# Patient Record
Sex: Female | Born: 1960 | Race: White | Hispanic: No | Smoking: Never smoker
Health system: Southern US, Community
[De-identification: ages and names within clinical notes are randomized; demographics above are authoritative.]

## PROBLEM LIST (undated history)

## (undated) HISTORY — PX: APPENDECTOMY: SHX54

## (undated) HISTORY — PX: CERVICAL ABLATION: SHX5771

---

## 2011-07-13 ENCOUNTER — Ambulatory Visit: Payer: Self-pay | Admitting: Family Medicine

## 2011-07-14 ENCOUNTER — Ambulatory Visit: Payer: Self-pay | Admitting: Family Medicine

## 2012-06-29 ENCOUNTER — Emergency Department: Payer: Self-pay | Admitting: Emergency Medicine

## 2012-06-29 LAB — CK TOTAL AND CKMB (NOT AT ARMC)
CK, Total: 52 U/L (ref 21–215)
CK-MB: <0.5 ng/mL — ABNORMAL LOW (ref 0.5–3.6)

## 2012-06-29 LAB — CBC
Platelet: 219 10*3/uL (ref 150–440)
RBC: 4.46 10*6/uL (ref 3.80–5.20)
WBC: 4.4 10*3/uL (ref 3.6–11.0)

## 2012-06-29 LAB — TSH: Thyroid Stimulating Horm: 4.61 u[IU]/mL — ABNORMAL HIGH

## 2012-06-29 LAB — BASIC METABOLIC PANEL
Co2: 24 mmol/L (ref 21–32)
Creatinine: 1.01 mg/dL (ref 0.60–1.30)
EGFR (African American): >60
EGFR (Non-African Amer.): >60
Osmolality: 284 (ref 275–301)
Sodium: 141 mmol/L (ref 136–145)

## 2012-06-29 LAB — T4, FREE: Free Thyroxine: 1.18 ng/dL (ref 0.76–1.46)

## 2013-07-09 IMAGING — CR DG CHEST 1V PORT
1 series · 1 of 1 positions shown · non-contrast
Comparison: none

REASON FOR EXAM: Chest Pain
COMMENTS:

PROCEDURE:     DXR - DXR PORTABLE CHEST SINGLE VIEW  - June 29, 2012  [DATE]
RESULT:     The lungs are adequately inflated. There is no focal infiltrate.
The cardiac silhouette is normal in size. The pulmonary vascularity is not
engorged. There is no pleural effusion.

[portable]
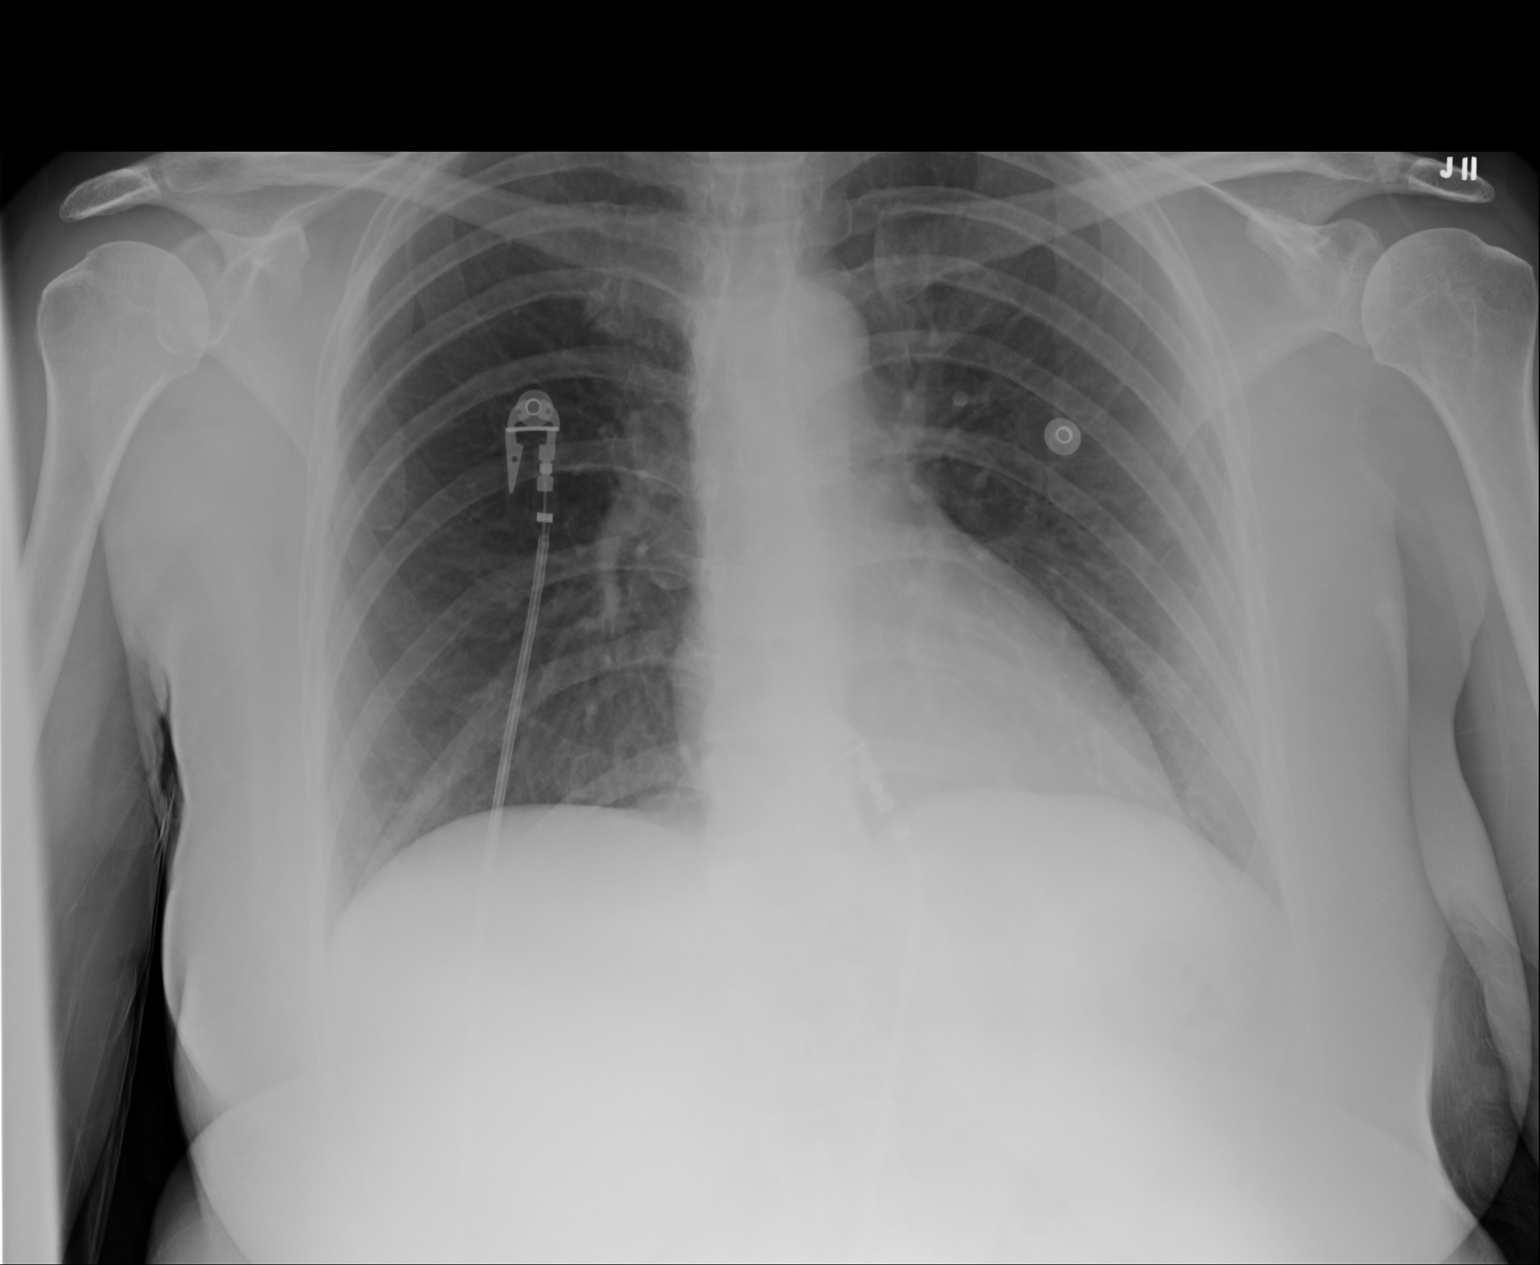

[1 of 1 positions shown; findings below may reference images not displayed]

IMPRESSION: There is no evidence of acute cardiopulmonary abnormality.

## 2017-04-10 ENCOUNTER — Ambulatory Visit
Admission: EM | Admit: 2017-04-10 | Discharge: 2017-04-10 | Disposition: A | Discharge status: Home / Self Care | Payer: Self-pay | Attending: Emergency Medicine | Admitting: Emergency Medicine

## 2017-04-10 ENCOUNTER — Ambulatory Visit (INDEPENDENT_AMBULATORY_CARE_PROVIDER_SITE_OTHER): Payer: Self-pay

## 2017-04-10 DIAGNOSIS — M25552 Pain in left hip: Secondary | ICD-10-CM

## 2017-04-10 DIAGNOSIS — M25559 Pain in unspecified hip: Secondary | ICD-10-CM

## 2017-04-10 DIAGNOSIS — S73102A Unspecified sprain of left hip, initial encounter: Secondary | ICD-10-CM

## 2017-04-10 MED ORDER — HYDROCODONE-ACETAMINOPHEN 5-325 MG PO TABS: 1.0000 | ORAL_TABLET | ORAL | 0 refills | Status: DC | PRN

## 2017-04-10 NOTE — Discharge Instructions (Signed)
1 g of Tylenol with 500 mg of Aleve twice a day. You may take an additional gram of Tylenol twice a day. Or if your pain is not controlled with this, you may take the Norco. Do not take the Norco and the Tylenol, as they both have Tylenol in them and too much can hurt your liver. Each tablet is Norco as 325 mg of Tylenol in it. Do not exceed 4 g of Tylenol from all sources in one day.

## 2017-04-10 NOTE — ED Triage Notes (Signed)
Patient complains of left hip pain that started on 10 days ago. Patient states that she saw her chiropractor on Wednesday and noticed improvement on Friday. Patient states that she fell coming down off of a step ladder last night and landed with all her weight on her left leg and jammed. Patient states that she heard a loud noise and then instantly had excruiating pain. Patient states that pain is still constant and caused her to have diarrhea and be nausea.

## 2017-04-10 NOTE — ED Provider Notes (Signed)
HPI  SUBJECTIVE:  Andrea Campbell is a 56 y.o. female who presents with left groin pain starting yesterday after she came off the stepladder onto a fully extended left leg. She states that she heard a pop, but was not sure of the location. She states that was followed by immediate severe pain that shot to her knee and nausea. She now reports constant discomfort and sharp, stabbing pain with any movement of her hip whatsoever particularly with hip flexion. She tried heat, and 2 tabs of Aleve at a time. The heat and keeping her leg straight seems to help. Symptoms are worse with hip flexion or with any movement. She denies bruising, swelling, numbness, tingling, distal weakness, knee pain, back pain. No rash. She states that she was having pain in her left hip all week, but states this feels different. She can bear weight on it with some discomfort. She has a past medical history of left hip bursitis. No history of diabetes, hypertension, osteoporosis, cancer. LMP: Status post ablation. PMD: None.    History reviewed. No pertinent past medical history.  Past Surgical History:  Procedure Laterality Date  . APPENDECTOMY    . CERVICAL ABLATION      History reviewed. No pertinent family history.  Social History  Substance Use Topics  . Smoking status: Never Smoker  . Smokeless tobacco: Never Used  . Alcohol use No    No current facility-administered medications for this encounter.   Current Outpatient Prescriptions:  .  progesterone (PROMETRIUM) 200 MG capsule, Take 200 mg by mouth daily., Disp: , Rfl:  .  thyroid (ARMOUR) 65 MG tablet, Take 65 mg by mouth daily., Disp: , Rfl:   No Known Allergies   ROS  As noted in HPI.   Physical Exam  BP (!) 117/52 (BP Location: Left Arm)   Pulse (!) 120   Temp 97.7 F (36.5 C) (Oral)   Resp 18   Ht 5' 2.5" (1.588 m)   Wt 155 lb (70.3 kg)   SpO2 100%   BMI 27.90 kg/m   Constitutional: Well developed, well nourished, appears  uncomfortable Eyes:  EOMI, conjunctiva normal bilaterally HENT: Normocephalic, atraumatic,mucus membranes moist Respiratory: Normal inspiratory effort Cardiovascular: Normal rate GI: nondistended skin: No rash, skin intact Musculoskeletal: No signs of trauma L hip. No bruising, erythema, rash. no muscular tenderness over gluteal muscles, down IT band, quadriceps. Tenderness over the hamstrings. pain with passive abduction>adduction of leg. pain with int>ext rotation hip. Discomfort but no real pain with flexion. No tenderness at sciatic notch. Roll test for muscle spasm negative. Flexion/extension knee WNL. Knee joint NT, stable. Motor strength flexion/ext hip 4/5. Sensation to LT intact. Patient able to bear some weight on her hip. Neurologic: Alert & oriented x 3, no focal neuro deficits Psychiatric: Speech and behavior appropriate   ED Course   Medications - No data to display  Orders Placed This Encounter  Procedures  . DG Hip Unilat With Pelvis 2-3 Views Left    Standing Status:   Standing    Number of Occurrences:   1    Order Specific Question:   Symptom/Reason for Exam    Answer:   Hip pain [[id number]]    Order Specific Question:   Radiology Contrast Protocol - do NOT remove file path    Answer:   [email or web address]    No results found for this or any previous visit (from the past 24 hour(s)). Dg Hip Unilat With Pelvis 2-3 Views Left  Result Date:  04/10/2017 CLINICAL DATA:  Pain after slipping on ladder EXAM: DG HIP (WITH OR WITHOUT PELVIS) 2-3V LEFT COMPARISON:  None. FINDINGS: Frontal pelvis as well as frontal and lateral left hip images were obtained. There is no fracture or dislocation. Joint spaces appear normal. No erosive change. IMPRESSION: No fracture or dislocation.  No appreciable arthropathy. Electronically Signed   By: Bretta Bang III M.D.   On: 04/10/2017 13:41    ED Clinical Impression  Hip pain - Plan: DG Hip  Unilat With Pelvis 2-3 Views Left, DG Hip Unilat With Pelvis 2-3 Views Left   ED Assessment/Plan  Checking left hip x-ray to rule out fracture. Patient took Aleve prior to evaluation so withholding Toradol.  Imaging independently reviewed. No fracture or dislocation. Normal joint spaces. See radiology report for details.  No evidence of fracture, although she may have a hairline fracture not seen on x-ray. No evidence of septic joint. Presentation most consistent with a hip sprain. Home with Norco, continue Aleve, continue heat, follow-up with Dr. Martha Clan, orthopedic surgeon on call if no better in several days. She states she has plenty of Aleve at home. To the ER if she gets worse or for pain not controlled with medicines.   Discussed  imaging, MDM, plan and followup with patient. Discussed sn/sx that should prompt return to the ED. Patient agrees with plan.   Meds ordered this encounter  Medications  . thyroid (ARMOUR) 65 MG tablet    Sig: Take 65 mg by mouth daily.  . progesterone (PROMETRIUM) 200 MG capsule    Sig: Take 200 mg by mouth daily.    *This clinic note was created using Dragon dictation software. Therefore, there may be occasional mistakes despite careful proofreading.  ?   Domenick Gong, MD 04/10/17 1756

## 2017-12-30 ENCOUNTER — Other Ambulatory Visit: Payer: Self-pay

## 2017-12-30 ENCOUNTER — Ambulatory Visit
Admission: EM | Admit: 2017-12-30 | Discharge: 2017-12-30 | Disposition: A | Discharge status: Home / Self Care | Payer: Self-pay | Attending: Family Medicine | Admitting: Family Medicine

## 2017-12-30 DIAGNOSIS — K8050 Calculus of bile duct without cholangitis or cholecystitis without obstruction: Secondary | ICD-10-CM

## 2017-12-30 DIAGNOSIS — R110 Nausea: Secondary | ICD-10-CM

## 2017-12-30 DIAGNOSIS — R1010 Upper abdominal pain, unspecified: Secondary | ICD-10-CM

## 2017-12-30 LAB — URINALYSIS, COMPLETE (UACMP) WITH MICROSCOPIC
BILIRUBIN URINE: NEGATIVE
Glucose, UA: NEGATIVE mg/dL
HGB URINE DIPSTICK: NEGATIVE
KETONES UR: NEGATIVE mg/dL
LEUKOCYTES UA: NEGATIVE
NITRITE: NEGATIVE
PROTEIN: NEGATIVE mg/dL
Specific Gravity, Urine: 1.01 (ref 1.005–1.030)
pH: 6.5 (ref 5.0–8.0)

## 2017-12-30 LAB — COMPREHENSIVE METABOLIC PANEL
ALBUMIN: 4.3 g/dL (ref 3.5–5.0)
ALT: 25 U/L (ref 14–54)
ANION GAP: 7 (ref 5–15)
AST: 28 U/L (ref 15–41)
Alkaline Phosphatase: 80 U/L (ref 38–126)
BUN: 21 mg/dL — ABNORMAL HIGH (ref 6–20)
CHLORIDE: 98 mmol/L — AB (ref 101–111)
CO2: 28 mmol/L (ref 22–32)
Calcium: 8.9 mg/dL (ref 8.9–10.3)
Creatinine, Ser: 0.98 mg/dL (ref 0.44–1.00)
GFR calc Af Amer: >60 mL/min (ref >60)
GFR calc non Af Amer: >60 mL/min (ref >60)
GLUCOSE: 96 mg/dL (ref 65–99)
POTASSIUM: 4.2 mmol/L (ref 3.5–5.1)
SODIUM: 133 mmol/L — AB (ref 135–145)
Total Bilirubin: 0.5 mg/dL (ref 0.3–1.2)
Total Protein: 7.7 g/dL (ref 6.5–8.1)

## 2017-12-30 LAB — CBC WITH DIFFERENTIAL/PLATELET
BASOS PCT: 1 %
Basophils Absolute: 0 10*3/uL (ref 0–0.1)
EOS ABS: 0.3 10*3/uL (ref 0–0.7)
EOS PCT: 5 %
HCT: 41.9 % (ref 35.0–47.0)
Hemoglobin: 14.5 g/dL (ref 12.0–16.0)
Lymphocytes Relative: 38 %
Lymphs Abs: 2 10*3/uL (ref 1.0–3.6)
MCH: 31.2 pg (ref 26.0–34.0)
MCHC: 34.6 g/dL (ref 32.0–36.0)
MCV: 90.3 fL (ref 80.0–100.0)
MONOS PCT: 11 %
Monocytes Absolute: 0.6 10*3/uL (ref 0.2–0.9)
Neutro Abs: 2.4 10*3/uL (ref 1.4–6.5)
Neutrophils Relative %: 45 %
PLATELETS: 241 10*3/uL (ref 150–440)
RBC: 4.64 MIL/uL (ref 3.80–5.20)
RDW: 13.1 % (ref 11.5–14.5)
WBC: 5.3 10*3/uL (ref 3.6–11.0)

## 2017-12-30 LAB — LIPASE, BLOOD: Lipase: 28 U/L (ref 11–51)

## 2017-12-30 MED ORDER — ALBUTEROL SULFATE (2.5 MG/3ML) 0.083% IN NEBU: 2.5000 mg | INHALATION_SOLUTION | Freq: Once | RESPIRATORY_TRACT | Status: DC

## 2017-12-30 MED ORDER — HYDROCODONE-ACETAMINOPHEN 5-325 MG PO TABS: ORAL_TABLET | ORAL | 0 refills | Status: DC

## 2017-12-30 MED ORDER — ONDANSETRON 8 MG PO TBDP
8.0000 mg | ORAL_TABLET | Freq: Once | ORAL | Status: AC
  Administered 2017-12-30: 8 mg via ORAL

## 2017-12-30 MED ORDER — ONDANSETRON 8 MG PO TBDP: 8.0000 mg | ORAL_TABLET | Freq: Three times a day (TID) | ORAL | 0 refills | Status: DC | PRN

## 2017-12-30 NOTE — Discharge Instructions (Signed)
Go to Emergency Department if symptoms worsen °

## 2017-12-30 NOTE — ED Triage Notes (Signed)
Patient complains of abdominal pain that started originally 2 weeks ago. Patient states that since then she has been having nausea, vomiting and discomfort. Patient states that she feels like this could be her gallbladder. Patient states that she notices pressure in her abdomen and her back when the episodes happen.

## 2017-12-30 NOTE — ED Provider Notes (Signed)
[facility]    CSN: [id number] Arrival date & time: 12/30/17  1356     History   Chief Complaint Chief Complaint  Patient presents with  . Abdominal Pain    HPI Andrea Campbell is a 57 y.o. female.   57 yo female with a c/o abdominal pain on and off for the past 2 weeks. Pain is in the upper abdomen , mainly on the right side, and associated with nausea and occasionally vomiting. Denies any fevers, chills, melena, hematochezia, hematemesis. States she ate several hours ago, now nauseated.    The history is provided by the patient.  Abdominal Pain    History reviewed. No pertinent past medical history.  There are no active problems to display for this patient.   Past Surgical History:  Procedure Laterality Date  . APPENDECTOMY    . CERVICAL ABLATION      OB History    No data available       Home Medications    Prior to Admission medications   Medication Sig Start Date End Date Taking? Authorizing Provider  progesterone (PROMETRIUM) 200 MG capsule Take 200 mg by mouth daily.   Yes [provider]  thyroid (ARMOUR) 65 MG tablet Take 65 mg by mouth daily.   Yes [provider]  HYDROcodone-acetaminophen (NORCO/VICODIN) 5-325 MG tablet 1-2 tabs po q 8 hours prn 12/30/17   Payton Mccallum, MD  ondansetron (ZOFRAN ODT) 8 MG disintegrating tablet Take 1 tablet (8 mg total) by mouth every 8 (eight) hours as needed. 12/30/17   Payton Mccallum, MD    Family History Family History  Problem Relation Age of Onset  . Heart failure Mother   . Dementia Father     Social History Social History   Tobacco Use  . Smoking status: Never Smoker  . Smokeless tobacco: Never Used  Substance Use Topics  . Alcohol use: Yes    Comment: occasionally  . Drug use: No     Allergies   Patient has no known allergies.   Review of Systems Review of Systems  Gastrointestinal: Positive for abdominal pain.     Physical Exam Triage Vital Signs ED  Triage Vitals  Enc Vitals Group     BP 12/30/17 1410 125/67     Pulse Rate 12/30/17 1410 (!) 101     Resp 12/30/17 1410 18     Temp 12/30/17 1410 97.9 F (36.6 C)     Temp Source 12/30/17 1410 Oral     SpO2 12/30/17 1410 100 %     Weight 12/30/17 1407 165 lb (74.8 kg)     Height 12/30/17 1407 5' 2.5" (1.588 m)     Head Circumference --      Peak Flow --      Pain Score 12/30/17 1407 1     Pain Loc --      Pain Edu? --      Excl. in GC? --    No data found.  Updated Vital Signs BP 125/67 (BP Location: Left Arm)   Pulse (!) 101   Temp 97.9 F (36.6 C) (Oral)   Resp 18   Ht 5' 2.5" (1.588 m)   Wt 165 lb (74.8 kg)   SpO2 100%   BMI 29.70 kg/m   Visual Acuity Right Eye Distance:   Left Eye Distance:   Bilateral Distance:    Right Eye Near:   Left Eye Near:    Bilateral Near:     Physical  Exam  Constitutional: She appears well-developed and well-nourished. No distress.  Pulmonary/Chest: Effort normal. No stridor. No respiratory distress. She has no wheezes.  Abdominal: Soft. Bowel sounds are normal. She exhibits no distension and no mass. There is tenderness (mild; upper abdomen; no rebound or guarding). There is no rebound and no guarding.  Skin: She is not diaphoretic.  Nursing note and vitals reviewed.    UC Treatments / Results  Labs (all labs ordered are listed, but only abnormal results are displayed) Labs Reviewed  COMPREHENSIVE METABOLIC PANEL - Abnormal; Notable for the following components:      Result Value   Sodium 133 (*)    Chloride 98 (*)    BUN 21 (*)    All other components within normal limits  URINALYSIS, COMPLETE (UACMP) WITH MICROSCOPIC - Abnormal; Notable for the following components:   Squamous Epithelial / LPF 0-5 (*)    Bacteria, UA FEW (*)    All other components within normal limits  URINE CULTURE  CBC WITH DIFFERENTIAL/PLATELET  LIPASE, BLOOD    EKG  EKG Interpretation None       Radiology No results  found.  Procedures Procedures (including critical care time)  Medications Ordered in UC Medications  ondansetron (ZOFRAN-ODT) disintegrating tablet 8 mg (8 mg Oral Given 12/30/17 1436)     Initial Impression / Assessment and Plan / UC Course  I have reviewed the triage vital signs and the nursing notes.  Pertinent labs & imaging results that were available during my care of the patient were reviewed by me and considered in my medical decision making (see chart for details).       Final Clinical Impressions(s) / UC Diagnoses   Final diagnoses:  Pain of upper abdomen  Biliary colic    ED Discharge Orders        Ordered    US Abdomen Limited RUQ  Status:  Canceled     12/30/17 1540    ondansetron (ZOFRAN ODT) 8 MG disintegrating tablet  Every 8 hours PRN     12/30/17 1540    HYDROcodone-acetaminophen (NORCO/VICODIN) 5-325 MG tablet     12/30/17 1549     1. Lab results (normal/negative) and diagnosis reviewed with patient 2. rx as per orders above; reviewed possible side effects, interactions, risks and benefits  3. Recommend supportive treatment with liquids and advance diet slowly as tolerated 4. Recommend go to Emergency Department if symptoms worsen over the weekend, otherwise follow up with PCP for possible referral for further work up 5. Follow-up prn if symptoms worsen or don't improve  Controlled Substance Prescriptions [place] Controlled Substance Registry consulted? Not Applicable   Payton Mccallumonty, Bodi Palmeri, MD 12/30/17 [phone]

## 2018-01-01 LAB — URINE CULTURE
Culture: NO GROWTH
SPECIAL REQUESTS: NORMAL

## 2018-01-02 ENCOUNTER — Telehealth: Payer: Self-pay | Admitting: Emergency Medicine

## 2018-01-02 NOTE — Telephone Encounter (Signed)
Called to follow up after patient's recent visit. Left message to call with any questions or concerns. 

## 2018-01-09 ENCOUNTER — Emergency Department: Payer: Self-pay

## 2018-01-09 ENCOUNTER — Emergency Department
Admission: EM | Admit: 2018-01-09 | Discharge: 2018-01-09 | Disposition: A | Discharge status: Home / Self Care | Payer: Self-pay | Attending: Emergency Medicine | Admitting: Emergency Medicine

## 2018-01-09 ENCOUNTER — Encounter: Payer: Self-pay | Admitting: Medical Oncology

## 2018-01-09 DIAGNOSIS — N200 Calculus of kidney: Secondary | ICD-10-CM | POA: Insufficient documentation

## 2018-01-09 DIAGNOSIS — Z79899 Other long term (current) drug therapy: Secondary | ICD-10-CM | POA: Insufficient documentation

## 2018-01-09 DIAGNOSIS — R197 Diarrhea, unspecified: Secondary | ICD-10-CM | POA: Insufficient documentation

## 2018-01-09 DIAGNOSIS — R1010 Upper abdominal pain, unspecified: Secondary | ICD-10-CM

## 2018-01-09 DIAGNOSIS — R112 Nausea with vomiting, unspecified: Secondary | ICD-10-CM | POA: Insufficient documentation

## 2018-01-09 LAB — COMPREHENSIVE METABOLIC PANEL
ALK PHOS: 66 U/L (ref 38–126)
ALT: 25 U/L (ref 14–54)
ANION GAP: 6 (ref 5–15)
AST: 24 U/L (ref 15–41)
Albumin: 4.4 g/dL (ref 3.5–5.0)
BUN: 22 mg/dL — ABNORMAL HIGH (ref 6–20)
CALCIUM: 9 mg/dL (ref 8.9–10.3)
CHLORIDE: 102 mmol/L (ref 101–111)
CO2: 28 mmol/L (ref 22–32)
Creatinine, Ser: 0.96 mg/dL (ref 0.44–1.00)
Glucose, Bld: 90 mg/dL (ref 65–99)
Potassium: 4 mmol/L (ref 3.5–5.1)
SODIUM: 136 mmol/L (ref 135–145)
Total Bilirubin: 0.9 mg/dL (ref 0.3–1.2)
Total Protein: 7.4 g/dL (ref 6.5–8.1)

## 2018-01-09 LAB — URINALYSIS, COMPLETE (UACMP) WITH MICROSCOPIC
Bilirubin Urine: NEGATIVE
Glucose, UA: NEGATIVE mg/dL
Ketones, ur: NEGATIVE mg/dL
Leukocytes, UA: NEGATIVE
NITRITE: NEGATIVE
Protein, ur: NEGATIVE mg/dL
SPECIFIC GRAVITY, URINE: 1.019 (ref 1.005–1.030)
pH: 5 (ref 5.0–8.0)

## 2018-01-09 LAB — CBC
HCT: 42.7 % (ref 35.0–47.0)
HEMOGLOBIN: 14.5 g/dL (ref 12.0–16.0)
MCH: 31.2 pg (ref 26.0–34.0)
MCHC: 34 g/dL (ref 32.0–36.0)
MCV: 91.6 fL (ref 80.0–100.0)
Platelets: 258 10*3/uL (ref 150–440)
RBC: 4.66 MIL/uL (ref 3.80–5.20)
RDW: 13.4 % (ref 11.5–14.5)
WBC: 4.8 10*3/uL (ref 3.6–11.0)

## 2018-01-09 LAB — LIPASE, BLOOD: LIPASE: 30 U/L (ref 11–51)

## 2018-01-09 LAB — POCT PREGNANCY, URINE: PREG TEST UR: NEGATIVE

## 2018-01-09 MED ORDER — ONDANSETRON 4 MG PO TBDP
4.0000 mg | ORAL_TABLET | Freq: Once | ORAL | Status: AC | PRN
  Administered 2018-01-09: 4 mg via ORAL
  Filled 2018-01-09: qty 1

## 2018-01-09 MED ORDER — SODIUM CHLORIDE 0.9 % IV SOLN
1000.0000 mL | Freq: Once | INTRAVENOUS | Status: AC
  Administered 2018-01-09: 1000 mL via INTRAVENOUS

## 2018-01-09 MED ORDER — ONDANSETRON 4 MG PO TBDP
4.0000 mg | ORAL_TABLET | Freq: Once | ORAL | Status: AC
  Administered 2018-01-09: 4 mg via ORAL

## 2018-01-09 MED ORDER — NAPROXEN 500 MG PO TABS: 500.0000 mg | ORAL_TABLET | Freq: Two times a day (BID) | ORAL | 2 refills | Status: DC

## 2018-01-09 MED ORDER — TAMSULOSIN HCL 0.4 MG PO CAPS: 0.4000 mg | ORAL_CAPSULE | Freq: Every day | ORAL | 0 refills | Status: DC

## 2018-01-09 MED ORDER — HYDROCODONE-ACETAMINOPHEN 5-325 MG PO TABS: 1.0000 | ORAL_TABLET | Freq: Four times a day (QID) | ORAL | 0 refills | Status: DC | PRN

## 2018-01-09 MED ORDER — ONDANSETRON 4 MG PO TBDP
ORAL_TABLET | ORAL | Status: AC
  Filled 2018-01-09: qty 1

## 2018-01-09 MED ORDER — ONDANSETRON 4 MG PO TBDP: 4.0000 mg | ORAL_TABLET | Freq: Three times a day (TID) | ORAL | 0 refills | Status: DC | PRN

## 2018-01-09 MED ORDER — PROMETHAZINE HCL 25 MG/ML IJ SOLN
12.5000 mg | Freq: Once | INTRAMUSCULAR | Status: AC
  Administered 2018-01-09: 12.5 mg via INTRAVENOUS
  Filled 2018-01-09: qty 1

## 2018-01-09 MED ORDER — KETOROLAC TROMETHAMINE 30 MG/ML IJ SOLN
30.0000 mg | Freq: Once | INTRAMUSCULAR | Status: AC
  Administered 2018-01-09: 30 mg via INTRAVENOUS
  Filled 2018-01-09: qty 1

## 2018-01-09 NOTE — ED Notes (Signed)
Per Dr. Cyril LoosenKinner, okay for patient to not get full bag of fluids at this time.

## 2018-01-09 NOTE — ED Provider Notes (Signed)
[facility] Emergency Department Provider Note   ____________________________________________    I have reviewed the triage vital signs and the nursing notes.   HISTORY  Chief Complaint Abdominal Pain and Flank Pain     HPI Andrea Campbell is a 57 y.o. female who presents with complaints of abdominal pain primarily in the right upper quadrant.  Patient reports over the last 1-2 months she has had numerous episodes of severe right upper quadrant pain occasionally followed by diarrhea but frequently with nausea and vomiting.  No history of kidney stones.  Has a history of an appendectomy.  She reports that the she generally has a dull ache in her right upper quadrant pretty much all the time now.  No fevers or chills.  No chest pain or shortness of breath or pleurisy.  She has not taken anything for the pain   History reviewed. No pertinent past medical history.  There are no active problems to display for this patient.   Past Surgical History:  Procedure Laterality Date  . APPENDECTOMY    . CERVICAL ABLATION      Prior to Admission medications   Medication Sig Start Date End Date Taking? Authorizing Provider  HYDROcodone-acetaminophen (NORCO/VICODIN) 5-325 MG tablet Take 1 tablet by mouth every 6 (six) hours as needed for severe pain. 01/09/18   Jene Every, MD  naproxen (NAPROSYN) 500 MG tablet Take 1 tablet (500 mg total) by mouth 2 (two) times daily with a meal. 01/09/18   Jene Every, MD  ondansetron (ZOFRAN ODT) 4 MG disintegrating tablet Take 1 tablet (4 mg total) by mouth every 8 (eight) hours as needed for nausea or vomiting. 01/09/18   Jene Every, MD  progesterone (PROMETRIUM) 200 MG capsule Take 200 mg by mouth daily.    [provider]  tamsulosin (FLOMAX) 0.4 MG CAPS capsule Take 1 capsule (0.4 mg total) by mouth daily. 01/09/18   Jene Every, MD  thyroid (ARMOUR) 65 MG tablet Take 65 mg by mouth daily.    [provider]     Allergies Patient has no known allergies.  Family History  Problem Relation Age of Onset  . Heart failure Mother   . Dementia Father     Social History Social History   Tobacco Use  . Smoking status: Never Smoker  . Smokeless tobacco: Never Used  Substance Use Topics  . Alcohol use: Yes    Comment: occasionally  . Drug use: No    Review of Systems  Constitutional: No fevers Eyes: No visual changes.  ENT: No neck pain Cardiovascular: Denies chest pain. Respiratory: Denies shortness of breath. Gastrointestinal: Abdominal pain as discussed above Genitourinary: Negative for dysuria. Musculoskeletal: Negative for body aches Skin: Negative for rash. Neurological: Negative for headaches    ____________________________________________   PHYSICAL EXAM:  VITAL SIGNS: ED Triage Vitals  Enc Vitals Group     BP 01/09/18 0957 120/73     Pulse Rate 01/09/18 0957 86     Resp 01/09/18 0957 18     Temp 01/09/18 0957 97.9 F (36.6 C)     Temp Source 01/09/18 0957 Oral     SpO2 01/09/18 0957 99 %     Weight 01/09/18 0958 74.8 kg (165 lb)     Height 01/09/18 0958 1.575 m (5\' 2" )     Head Circumference --      Peak Flow --      Pain Score 01/09/18 0957 2  Pain Loc --      Pain Edu? --      Excl. in GC? --     Constitutional: Alert and oriented. No acute distress. Pleasant and interactive Eyes: Conjunctivae are normal.   Nose: No congestion/rhinnorhea. Mouth/Throat: Mucous membranes are moist.    Cardiovascular: Normal rate, regular rhythm. Grossly normal heart sounds.  Good peripheral circulation. Respiratory: Normal respiratory effort.  No retractions. Lungs CTAB. Gastrointestinal: Soft, with moderate tenderness to palpation right upper quadrant. No distention.  No CVA tenderness. Genitourinary: deferred Musculoskeletal:  Warm and well perfused Neurologic:  Normal speech and language. No gross focal neurologic deficits are appreciated.    Skin:  Skin is warm, dry and intact. No rash noted. Psychiatric: Mood and affect are normal. Speech and behavior are normal.  ____________________________________________   LABS (all labs ordered are listed, but only abnormal results are displayed)  Labs Reviewed  COMPREHENSIVE METABOLIC PANEL - Abnormal; Notable for the following components:      Result Value   BUN 22 (*)    All other components within normal limits  URINALYSIS, COMPLETE (UACMP) WITH MICROSCOPIC - Abnormal; Notable for the following components:   Color, Urine YELLOW (*)    APPearance CLOUDY (*)    Hgb urine dipstick MODERATE (*)    Bacteria, UA RARE (*)    Squamous Epithelial / LPF 6-30 (*)    All other components within normal limits  LIPASE, BLOOD  CBC  POC URINE PREG, ED  POCT PREGNANCY, URINE   ____________________________________________  EKG  None ____________________________________________  RADIOLOGY  Ultrasound right upper quadrant ____________________________________________   PROCEDURES  Procedure(s) performed: No  Procedures   Critical Care performed: No ____________________________________________   INITIAL IMPRESSION / ASSESSMENT AND PLAN / ED COURSE  Pertinent labs & imaging results that were available during my care of the patient were reviewed by me and considered in my medical decision making (see chart for details).  Patient presents with intermittent episodes of right upper quadrant pain.  Differential diagnosis includes biliary colic, PUD/gastritis, renal colic less likely pancreatitis, colitis  Offered pain medication with the patient has not eaten in over 12 hours so would like to only have nausea medication at this time.  Lab work is overall reassuring LFTs unremarkable.  ----------------------------------------- 12:13 PM on 01/09/2018 -----------------------------------------  Ultrasound negative for gallstone.  Urinalysis demonstrates small amount of red  blood cells, potentially hydronephrosis causing right upper quadrant pain, will send for CT renal stone study.  ----------------------------------------- 1:12 PM on 01/09/2018 -----------------------------------------  Patient with 6 mm distal UVJ stone, discussed with Dr. Apolinar JunesBrandon, patient wants to try to pass the stone would like to avoid procedures, hence we will provide analgesics nausea medication and have her follow-up as an outpatient.  No evidence of infection.  Discussed with patient return precautions    ____________________________________________   FINAL CLINICAL IMPRESSION(S) / ED DIAGNOSES  Final diagnoses:  Upper abdominal pain  Kidney stone        Note:  This document was prepared using Dragon voice recognition software and may include unintentional dictation errors.    Jene EveryKinner, Yarieliz Wasser, MD 01/09/18 1314

## 2018-01-09 NOTE — ED Notes (Signed)
Pt discharged to home.  Family member driving.  Discharge instructions reviewed.  Verbalized understanding.  No questions or concerns at this time.  Teach back verified.  Pt in NAD.  No items left in ED.   

## 2018-01-09 NOTE — ED Notes (Signed)
Pt states R sided abd pain, right now LRQ. Pt states she thinks it's her  Gallbladder. Went to UC, was told she couldn't have US done until she hadn't eaten/drank anything x 6 hours. Pt states N&V&D at times, not always together. Denies urinary symptoms. Denies blood in urine. Still has gallbladder, no appendix. Alert, oriented, no distress noted.

## 2018-01-09 NOTE — ED Triage Notes (Signed)
Pt reports that she has been having rt sided abd pain that radiates around to her flank off and on for 4-5 weeks. Pt reports diarrhea and intermittent vomiting also. Denies fever.

## 2018-01-09 NOTE — ED Notes (Signed)
Pt taken to CT.

## 2018-01-10 ENCOUNTER — Ambulatory Visit: Payer: Self-pay | Admitting: Urology

## 2018-01-12 ENCOUNTER — Ambulatory Visit (INDEPENDENT_AMBULATORY_CARE_PROVIDER_SITE_OTHER): Payer: Self-pay | Admitting: Urology

## 2018-01-12 ENCOUNTER — Encounter: Payer: Self-pay | Admitting: Urology

## 2018-01-12 VITALS — BP 119/75 | HR 108 | Ht 62.0 in | Wt 174.1 lb

## 2018-01-12 DIAGNOSIS — N200 Calculus of kidney: Secondary | ICD-10-CM

## 2018-01-12 LAB — MICROSCOPIC EXAMINATION

## 2018-01-12 LAB — URINALYSIS, COMPLETE
Bilirubin, UA: NEGATIVE
Glucose, UA: NEGATIVE
Ketones, UA: NEGATIVE
NITRITE UA: NEGATIVE
Protein, UA: NEGATIVE
Specific Gravity, UA: 1.025 (ref 1.005–1.030)
UUROB: 0.2 mg/dL (ref 0.2–1.0)
pH, UA: 5.5 (ref 5.0–7.5)

## 2018-01-12 MED ORDER — ONDANSETRON HCL 8 MG PO TABS: 8.0000 mg | ORAL_TABLET | Freq: Three times a day (TID) | ORAL | 0 refills | Status: DC | PRN

## 2018-01-12 MED ORDER — TAMSULOSIN HCL 0.4 MG PO CAPS
0.4000 mg | ORAL_CAPSULE | Freq: Every day | ORAL | 0 refills | Status: DC
Start: 2018-01-12 — End: 2018-07-20

## 2018-01-12 MED ORDER — HYDROCODONE-ACETAMINOPHEN 5-325 MG PO TABS: 1.0000 | ORAL_TABLET | ORAL | 0 refills | Status: DC | PRN

## 2018-01-12 NOTE — Progress Notes (Signed)
01/12/2018 11:23 AM   Andrea Campbell [birth year] [id number]  Referring provider: No referring provider defined for this encounter.  Chief Complaint  Patient presents with  . Nephrolithiasis    HPI: The patient is a 57 year old female presents today for ER follow-up of a 6 mm UVJ stone.  She was also noted to have clinically insignificant bilateral punctate nonobstructing stones.  This has been intermittently giving her issues for 3-4 weeks.  Was finally diagnosed a few days ago.  She has right flank pain as well as nausea and vomiting.  She is requiring Zofran and Norco at this time.  She has not been straining her urine.  She still has intermittent pain.  She has never had a kidney stone before.  Of note, she does have persistent blood in her urine today.   PMH: History reviewed. No pertinent past medical history.  Surgical History: Past Surgical History:  Procedure Laterality Date  . APPENDECTOMY    . CERVICAL ABLATION      Home Medications:  Allergies as of 01/12/2018      Reactions   Flu Virus Vaccine       Medication List        Accurate as of 01/12/18 11:23 AM. Always use your most recent med list.          clonazePAM 0.5 MG tablet Commonly known as:  KLONOPIN Take by mouth.   HYDROcodone-acetaminophen 5-325 MG tablet Commonly known as:  NORCO/VICODIN Take 1 tablet by mouth every 6 (six) hours as needed for severe pain.   HYDROcodone-acetaminophen 5-325 MG tablet Commonly known as:  NORCO Take 1 tablet by mouth every 4 (four) hours as needed for moderate pain.   naproxen 500 MG tablet Commonly known as:  NAPROSYN Take 1 tablet (500 mg total) by mouth 2 (two) times daily with a meal.   ondansetron 4 MG disintegrating tablet Commonly known as:  ZOFRAN ODT Take 1 tablet (4 mg total) by mouth every 8 (eight) hours as needed for nausea or vomiting.   ondansetron 8 MG tablet Commonly known as:  ZOFRAN Take 1 tablet (8 mg total) by mouth every 8 (eight)  hours as needed for nausea or vomiting.   progesterone 200 MG capsule Commonly known as:  PROMETRIUM Take 200 mg by mouth daily.   tamsulosin 0.4 MG Caps capsule Commonly known as:  FLOMAX Take 1 capsule (0.4 mg total) by mouth daily.   tamsulosin 0.4 MG Caps capsule Commonly known as:  FLOMAX Take 1 capsule (0.4 mg total) by mouth daily.   thyroid 65 MG tablet Commonly known as:  ARMOUR Take 65 mg by mouth daily.       Allergies:  Allergies  Allergen Reactions  . Flu Virus Vaccine     Family History: Family History  Problem Relation Age of Onset  . Heart failure Mother   . Dementia Father     Social History:  reports that  has never smoked. she has never used smokeless tobacco. She reports that she drinks alcohol. She reports that she does not use drugs.  ROS: UROLOGY Frequent Urination?: No Hard to postpone urination?: No Burning/pain with urination?: No Get up at night to urinate?: No Leakage of urine?: No Urine stream starts and stops?: No Trouble starting stream?: No Do you have to strain to urinate?: No Blood in urine?: No Urinary tract infection?: No Sexually transmitted disease?: No Injury to kidneys or bladder?: No Painful intercourse?: No Weak stream?: No Currently pregnant?: No  Vaginal bleeding?: No Last menstrual period?: n  Gastrointestinal Nausea?: Yes Vomiting?: Yes Indigestion/heartburn?: Yes Diarrhea?: Yes Constipation?: No  Constitutional Fever: No Night sweats?: No Weight loss?: No Fatigue?: No  Skin Skin rash/lesions?: No Itching?: No  Eyes Blurred vision?: No Double vision?: No  Ears/Nose/Throat Sore throat?: No Sinus problems?: No  Hematologic/Lymphatic Swollen glands?: No Easy bruising?: No  Cardiovascular Leg swelling?: No Chest pain?: No  Respiratory Cough?: No Shortness of breath?: No  Endocrine Excessive thirst?: No  Musculoskeletal Back pain?: No Joint pain?: No  Neurological Headaches?:  No Dizziness?: No  Psychologic Depression?: No Anxiety?: No  Physical Exam: BP 119/75 (BP Location: Right Arm, Patient Position: Sitting, Cuff Size: Normal)   Pulse (!) 108   Ht 5\' 2"  (1.575 m)   Wt 174 lb 1.6 oz (79 kg)   BMI 31.84 kg/m   Constitutional:  Alert and oriented, No acute distress. HEENT: [place] AT, moist mucus membranes.  Trachea midline, no masses. Cardiovascular: No clubbing, cyanosis, or edema. Respiratory: Normal respiratory effort, no increased work of breathing. GI: Abdomen is soft, nontender, nondistended, no abdominal masses GU: No CVA tenderness.  Skin: No rashes, bruises or suspicious lesions. Lymph: No cervical or inguinal adenopathy. Neurologic: Grossly intact, no focal deficits, moving all 4 extremities. Psychiatric: Normal mood and affect.  Laboratory Data: Lab Results  Component Value Date   WBC 4.8 01/09/2018   HGB 14.5 01/09/2018   HCT 42.7 01/09/2018   MCV 91.6 01/09/2018   PLT 258 01/09/2018    Lab Results  Component Value Date   CREATININE 0.96 01/09/2018    No results found for: PSA  No results found for: TESTOSTERONE  No results found for: HGBA1C  Urinalysis    Component Value Date/Time   COLORURINE YELLOW (A) 01/09/2018 1003   APPEARANCEUR CLOUDY (A) 01/09/2018 1003   LABSPEC 1.019 01/09/2018 1003   PHURINE 5.0 01/09/2018 1003   GLUCOSEU NEGATIVE 01/09/2018 1003   HGBUR MODERATE (A) 01/09/2018 1003   BILIRUBINUR NEGATIVE 01/09/2018 1003   KETONESUR NEGATIVE 01/09/2018 1003   PROTEINUR NEGATIVE 01/09/2018 1003   NITRITE NEGATIVE 01/09/2018 1003   LEUKOCYTESUR NEGATIVE 01/09/2018 1003    Pertinent Imaging: CT reviewed as above  Assessment & Plan:    1.  Right UVJ stone I discussed treatment options with the patient including medical expulsive therapy, lithotripsy, ureteroscopy.  She would like to continue medical expulsive therapy at this time.  I refilled her medications and gave her a strainer.  We discussed the  risks and benefits of ureteroscopy in great detail.  She has elected to proceed with this if her pain becomes unbearable.  She will call the office if this is the case.  She will otherwise follow-up in 2 weeks with a [facility] prior.   Return in about 2 weeks (around 01/26/2018) for [facility] prior.  Hildred LaserBrian James Mei Suits, MD  [facility] [address], [address] [place], [place] [address] [phone]

## 2018-01-16 ENCOUNTER — Other Ambulatory Visit: Payer: Self-pay | Admitting: Radiology

## 2018-01-16 ENCOUNTER — Telehealth: Payer: Self-pay | Admitting: Radiology

## 2018-01-16 ENCOUNTER — Inpatient Hospital Stay: Admission: RE | Admit: 2018-01-16 | Payer: Self-pay | Source: Ambulatory Visit

## 2018-01-16 DIAGNOSIS — N201 Calculus of ureter: Secondary | ICD-10-CM

## 2018-01-16 NOTE — Telephone Encounter (Signed)
Orders received from Dr Lonna CobbStoioff regarding scheduling surgery tomorrow. Pt aware. Instructions given. Pt voices understanding.

## 2018-01-16 NOTE — Telephone Encounter (Signed)
Pt states she is having moderate to severe pain from kidney stone. She had to stop taking naproxen related to GI distress (in the past all NSAIDS cause GI distress). Hydrocodone is causing constipation & nausea. Pt asks for alternative pain medication. Was seen by Dr Pilar Jarvis on 01/12/2018 who recommended MET vs URS. Pt elects to proceed with URS. Please advise.

## 2018-01-16 NOTE — Telephone Encounter (Signed)
Pt called back stating she would like to wait on surgery at this time d/t cost & wants to continue with MET. Advised pt to keep f/u appt with Dr Pilar Jarvis with [facility] prior. Pt voices understanding.

## 2018-01-19 NOTE — Telephone Encounter (Signed)
Pt states that after talking to the [facility] she wants to cancel follow up appt & surgery for kidney stone because she can't afford it & that she will go to another facility if she doesn't pass the stone.

## 2018-01-26 ENCOUNTER — Ambulatory Visit: Payer: Self-pay

## 2018-01-27 ENCOUNTER — Inpatient Hospital Stay: Admission: RE | Admit: 2018-01-27 | Payer: Self-pay | Source: Ambulatory Visit

## 2018-02-03 ENCOUNTER — Encounter: Admission: RE | Payer: Self-pay | Source: Ambulatory Visit

## 2018-02-03 ENCOUNTER — Ambulatory Visit: Admission: RE | Admit: 2018-02-03 | Payer: Self-pay | Source: Ambulatory Visit | Admitting: Urology

## 2018-02-03 SURGERY — CYSTOSCOPY/URETEROSCOPY/HOLMIUM LASER/STENT PLACEMENT
Anesthesia: Choice | Laterality: Right

## 2018-02-09 ENCOUNTER — Ambulatory Visit (INDEPENDENT_AMBULATORY_CARE_PROVIDER_SITE_OTHER): Payer: Self-pay

## 2018-02-09 ENCOUNTER — Ambulatory Visit
Admission: EM | Admit: 2018-02-09 | Discharge: 2018-02-09 | Disposition: A | Discharge status: Home / Self Care | Payer: Self-pay | Attending: Family Medicine | Admitting: Family Medicine

## 2018-02-09 DIAGNOSIS — S62357A Nondisplaced fracture of shaft of fifth metacarpal bone, left hand, initial encounter for closed fracture: Secondary | ICD-10-CM

## 2018-02-09 DIAGNOSIS — M25512 Pain in left shoulder: Secondary | ICD-10-CM

## 2018-02-09 DIAGNOSIS — M542 Cervicalgia: Secondary | ICD-10-CM

## 2018-02-09 MED ORDER — CYCLOBENZAPRINE HCL 10 MG PO TABS: 10.0000 mg | ORAL_TABLET | Freq: Three times a day (TID) | ORAL | 0 refills | Status: DC | PRN

## 2018-02-09 MED ORDER — HYDROCODONE-ACETAMINOPHEN 5-325 MG PO TABS: 1.0000 | ORAL_TABLET | Freq: Three times a day (TID) | ORAL | 0 refills | Status: DC | PRN
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
## 2018-02-09 NOTE — Discharge Instructions (Signed)
Call [facility] or [facility] ortho on Monday.  Medications as directed.  Take care  Dr. Adriana Simasook
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
## 2018-04-15 IMAGING — US US ABDOMEN LIMITED
1 series · 14 of 25 positions shown · non-contrast
Comparison: 07/14/2011 CT abdomen.

CLINICAL DATA: Upper abdominal pain and nausea for 2 weeks.

EXAM:
ULTRASOUND ABDOMEN LIMITED RIGHT UPPER QUADRANT

[Series 1: us abdomen limited · 0.17mm/px · 14 of 45 slices shown]
[im 1/45]
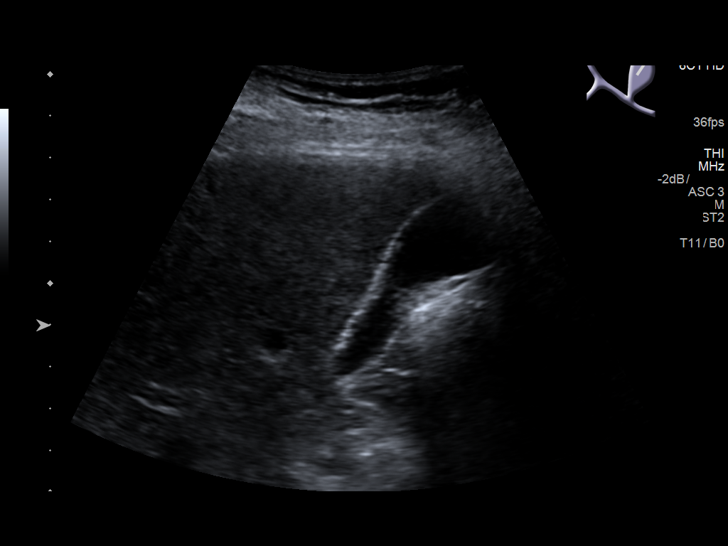
[im 4/45]
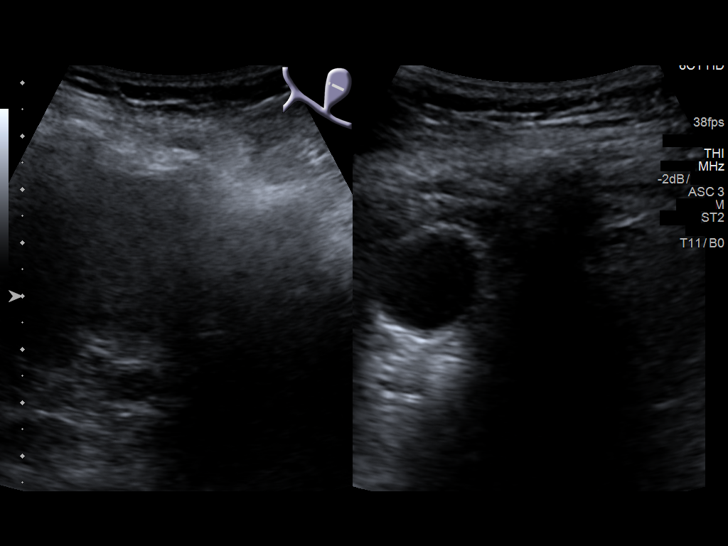
[im 8/45]
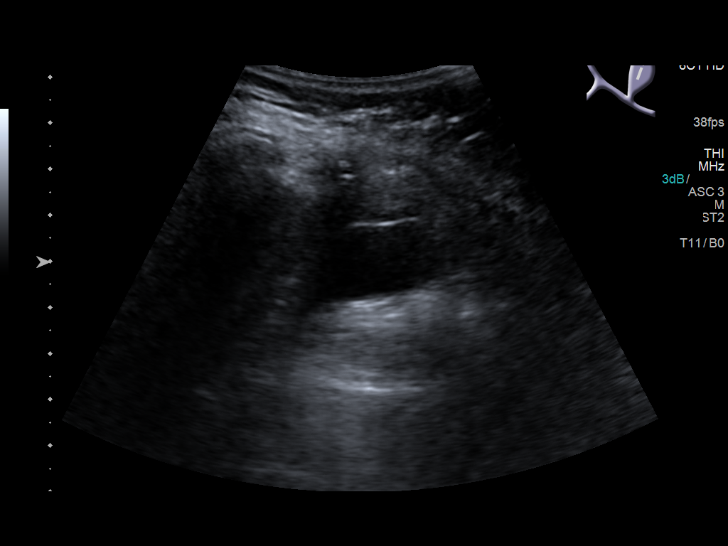
[im 12/45]
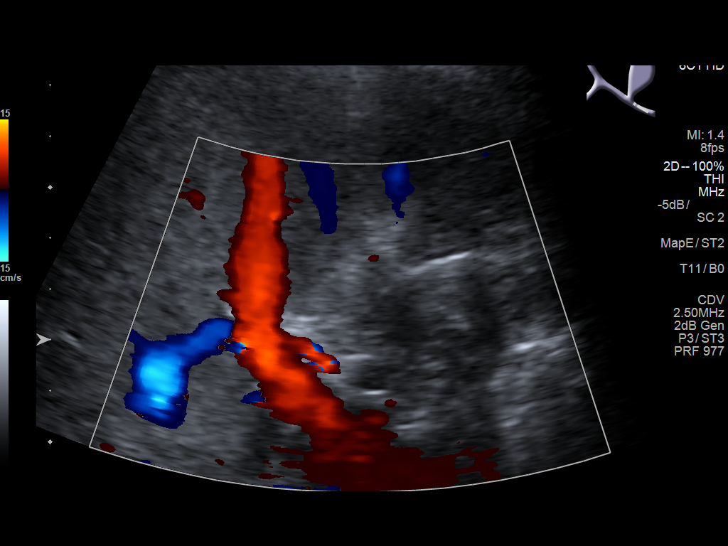
[im 15/45]
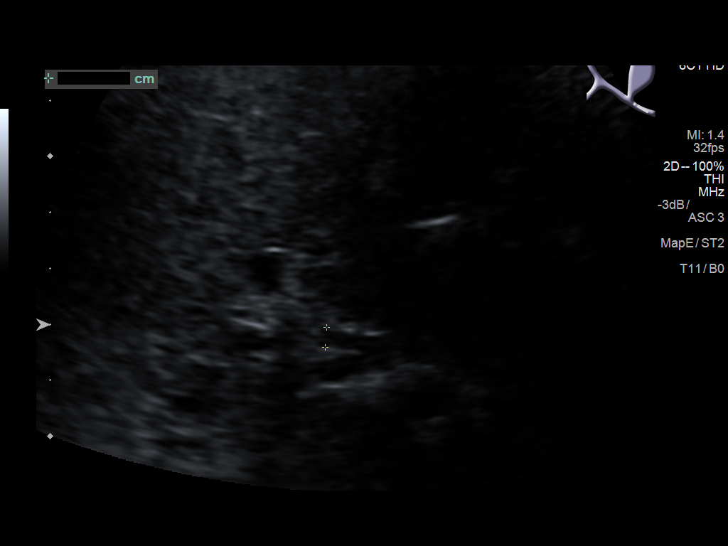
[im 17/45]
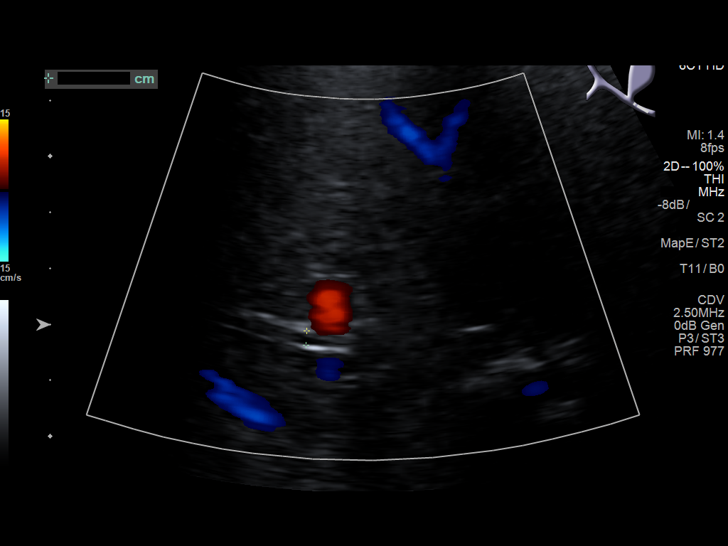
[im 21/45]
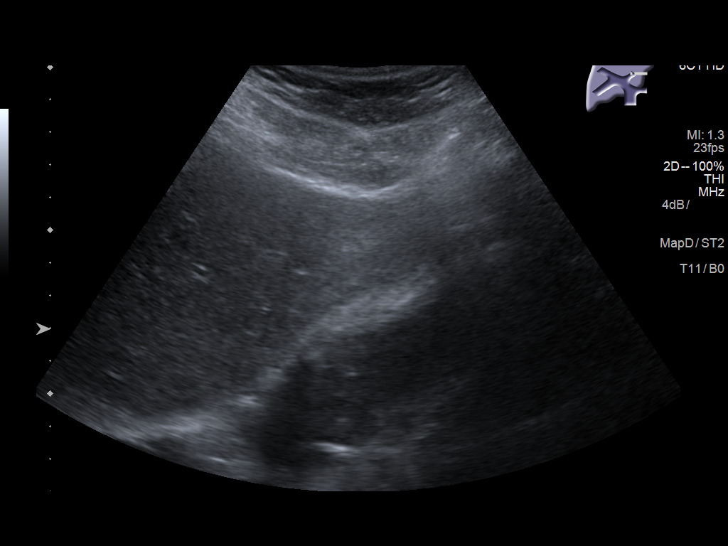
[im 24/45]
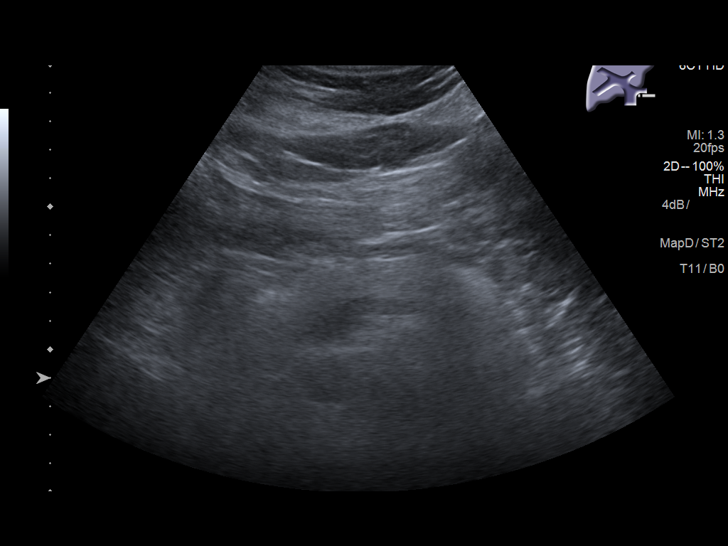
[im 28/45]
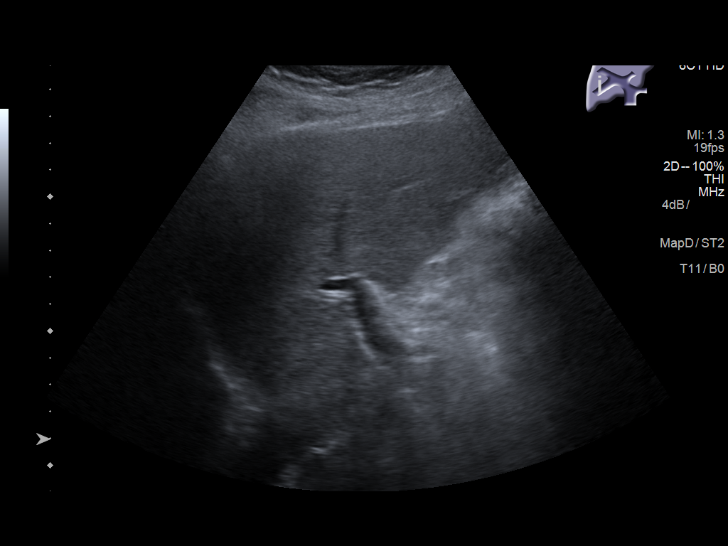
[im 30/45]
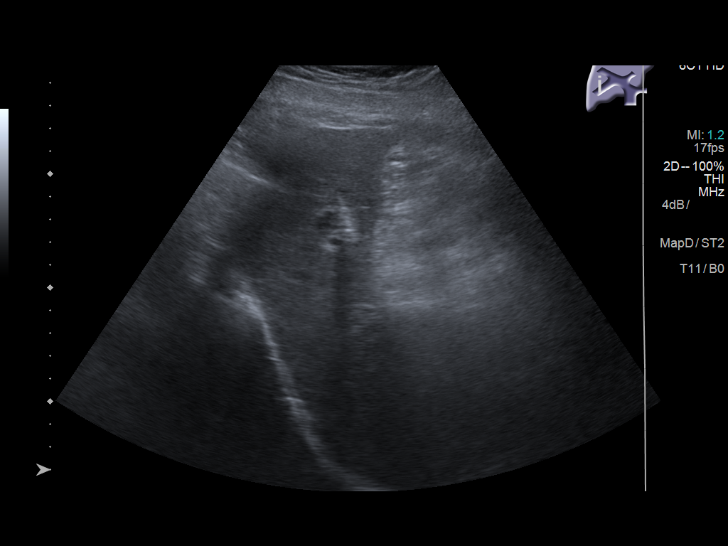
[im 34/45]
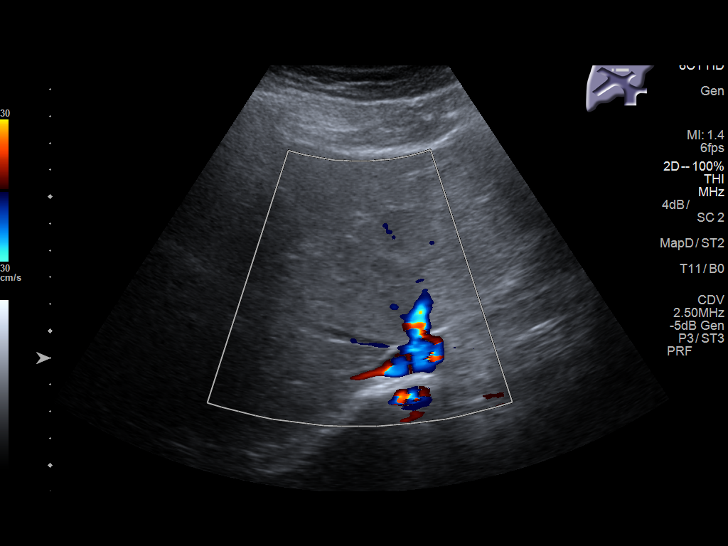
[im 37/45]
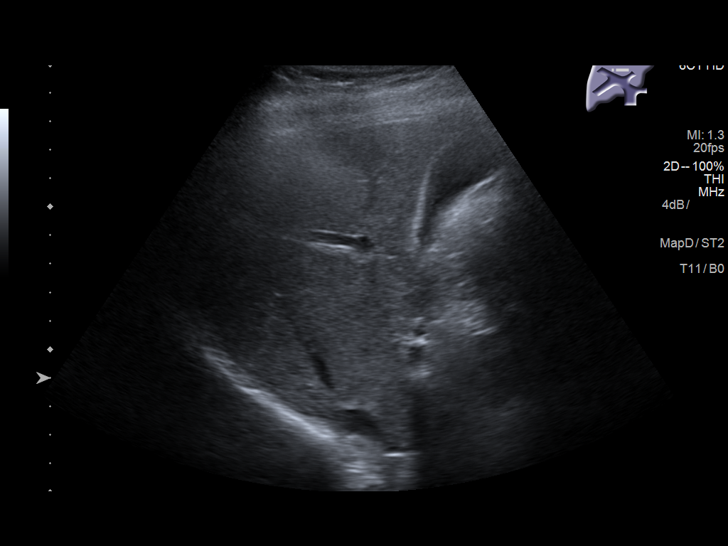
[im 41/45]
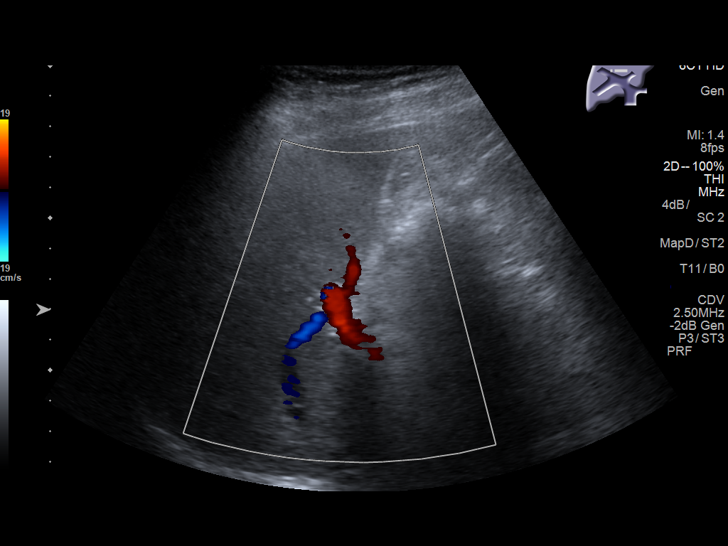
[im 45/45]
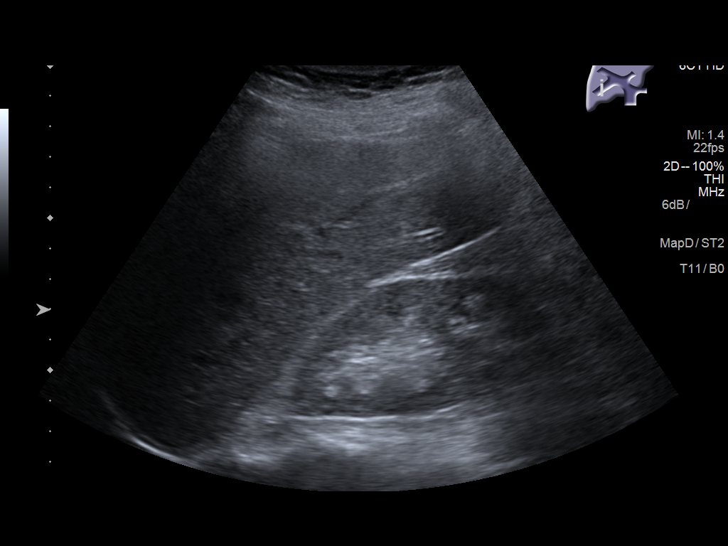

[14 of 25 positions shown; findings below may reference images not displayed]

FINDINGS: Gallbladder:

No gallstones or wall thickening visualized. No sonographic Murphy
sign noted by sonographer.

Common bile duct:

Diameter: 3 mm

Liver:

No focal lesion identified. Within normal limits in parenchymal
echogenicity. Portal vein is patent on color Doppler imaging with
normal direction of blood flow towards the liver.
IMPRESSION: Normal right upper quadrant abdominal sonogram, with no
cholelithiasis.

## 2018-07-20 ENCOUNTER — Ambulatory Visit
Admission: EM | Admit: 2018-07-20 | Discharge: 2018-07-20 | Disposition: A | Discharge status: Home / Self Care | Payer: Self-pay | Attending: Family Medicine | Admitting: Family Medicine

## 2018-07-20 DIAGNOSIS — R110 Nausea: Secondary | ICD-10-CM

## 2018-07-20 DIAGNOSIS — H8110 Benign paroxysmal vertigo, unspecified ear: Secondary | ICD-10-CM

## 2018-07-20 DIAGNOSIS — H9202 Otalgia, left ear: Secondary | ICD-10-CM

## 2018-07-20 MED ORDER — DIAZEPAM 2 MG PO TABS: 1.0000 mg | ORAL_TABLET | Freq: Two times a day (BID) | ORAL | 0 refills | Status: DC | PRN

## 2018-07-20 MED ORDER — ONDANSETRON HCL 4 MG PO TABS: 4.0000 mg | ORAL_TABLET | Freq: Three times a day (TID) | ORAL | 0 refills | Status: DC | PRN

## 2018-07-20 NOTE — Discharge Instructions (Signed)
Do not take both the valium and Klonopin.  Use the zofran for nausea. If persists, see ENT.  Rest.  Take care  Dr. Adriana Simasook

## 2018-07-20 NOTE — ED Provider Notes (Signed)
[facility]    CSN: [id number] Arrival date & time: 07/20/18  1722  History   Chief Complaint Chief Complaint  Patient presents with  . Otalgia    left side  . Dizziness  . Nausea   HPI  57 year old female presents with dizziness, nausea, ear fullness.  Patient reports a 5-day history of nausea, dizziness, and ear fullness.  Patient states that she was better yesterday but her symptoms worsened again today.  She reports dizziness which she describes as gait instability and "things spinning".  Worse with movement and looking right and left.  Associated nausea.  Patient states that she feels like she has an inner ear problem.  She has had this previously.  She has taken some leftover meclizine without resolution.  No fevers or chills.  No reports of headache.  No other reported symptoms.  No other complaints.  Social History Social History   Tobacco Use  . Smoking status: Never Smoker  . Smokeless tobacco: Never Used  Substance Use Topics  . Alcohol use: Yes    Comment: occasionally  . Drug use: No     Allergies   Flu virus vaccine and Haemophilus influenzae   Review of Systems Review of Systems  Gastrointestinal: Positive for nausea.  Neurological: Positive for dizziness.   Physical Exam Triage Vital Signs ED Triage Vitals  Enc Vitals Group     BP 07/20/18 1744 124/78     Pulse Rate 07/20/18 1744 80     Resp 07/20/18 1744 16     Temp 07/20/18 1744 98.2 F (36.8 C)     Temp Source 07/20/18 1744 Oral     SpO2 07/20/18 1744 99 %     Weight 07/20/18 1739 170 lb (77.1 kg)     Height 07/20/18 1739 5' 2.5" (1.588 m)     Head Circumference --      Peak Flow --      Pain Score 07/20/18 1738 3     Pain Loc --      Pain Edu? --      Excl. in GC? --    Updated Vital Signs BP 124/78   Pulse 80   Temp 98.2 F (36.8 C) (Oral)   Resp 16   Ht 5' 2.5" (1.588 m)   Wt 77.1 kg   SpO2 99%   BMI 30.60 kg/m   Visual Acuity Right Eye Distance:   Left Eye  Distance:   Bilateral Distance:    Right Eye Near:   Left Eye Near:    Bilateral Near:     Physical Exam  Constitutional: She is oriented to person, place, and time. She appears well-developed. No distress.  HENT:  Head: Normocephalic and atraumatic.  Normal TM's bilaterally.  Eyes: Pupils are equal, round, and reactive to light. Conjunctivae are normal. Right eye exhibits no discharge. Left eye exhibits no discharge.  Pulmonary/Chest: Effort normal. No respiratory distress.  Neurological: She is alert and oriented to person, place, and time.  Psychiatric: She has a normal mood and affect. Her behavior is normal.  Nursing note and vitals reviewed.  UC Treatments / Results  Labs (all labs ordered are listed, but only abnormal results are displayed) Labs Reviewed - No data to display  EKG None  Radiology No results found.  Procedures Procedures (including critical care time)  Medications Ordered in UC Medications - No data to display  Initial Impression / Assessment and Plan / UC Course  I have reviewed the triage vital  signs and the nursing notes.  Pertinent labs & imaging results that were available during my care of the patient were reviewed by me and considered in my medical decision making (see chart for details).    56 year old female presents with vertigo.  Treating with PRN Valium and Zofran. Rx given for Vestibular rehab. Can try [facility].   Final Clinical Impressions(s) / UC Diagnoses   Final diagnoses:  Benign paroxysmal positional vertigo, unspecified laterality     Discharge Instructions     Do not take both the valium and Klonopin.  Use the zofran for nausea. If persists, see ENT.  Rest.  Take care  Dr. Adriana Simas    ED Prescriptions    Medication Sig Dispense Auth. Provider   ondansetron (ZOFRAN) 4 MG tablet Take 1 tablet (4 mg total) by mouth every 8 (eight) hours as needed for nausea or vomiting. 20 tablet Velta Rockholt G, DO   diazepam (VALIUM) 2  MG tablet Take 0.5 tablets (1 mg total) by mouth every 12 (twelve) hours as needed (Vertigo). 10 tablet Tommie Sams, DO     Controlled Substance Prescriptions [place] Controlled Substance Registry consulted? Not Applicable   Tommie Sams, DO 07/20/18 1610

## 2018-07-20 NOTE — ED Triage Notes (Signed)
As per patient has inner Left ear pain and sinus and allergy gets dizzy, nausea onset 5 days.

## 2019-01-19 IMAGING — CT CT RENAL STONE PROTOCOL
2 of 4 series · 16 of 46 positions shown, 18 images · non-contrast
Comparison: 07/14/2011.

CLINICAL DATA: Right-sided abdominal pain that radiates to the
right flank for 4-5 weeks. Diarrhea and intermittent vomiting.

EXAM:
CT ABDOMEN AND PELVIS WITHOUT CONTRAST
TECHNIQUE: Multidetector CT imaging of the abdomen and pelvis was performed
following the standard protocol without IV contrast.

[Series 2: stone full standard · axial · 0.68mm/px · z∈[-430,-10]mm · 13 of 92 slices shown, 15 images]
[im 4/92  soft-tissue]
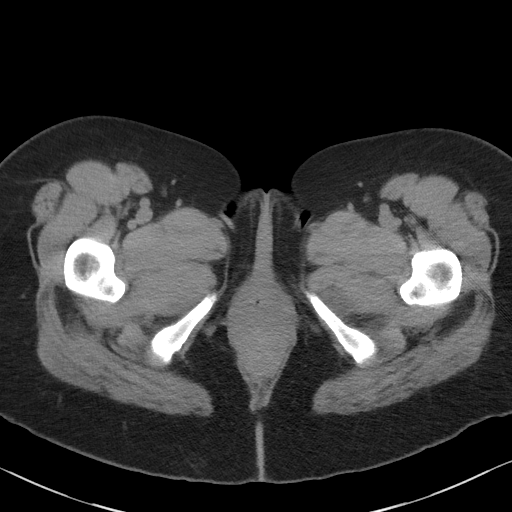
[im 4/92  bone]
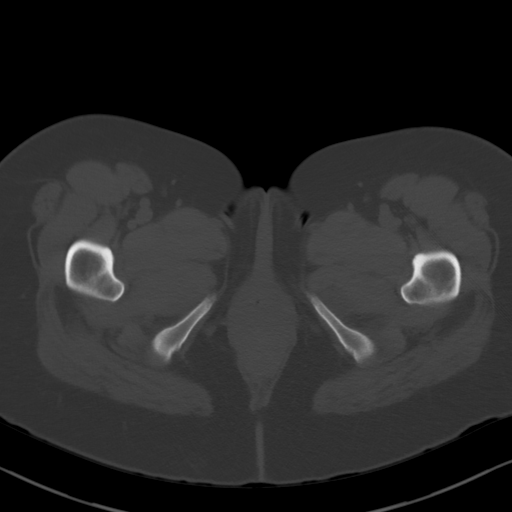
[im 12/92  soft-tissue]
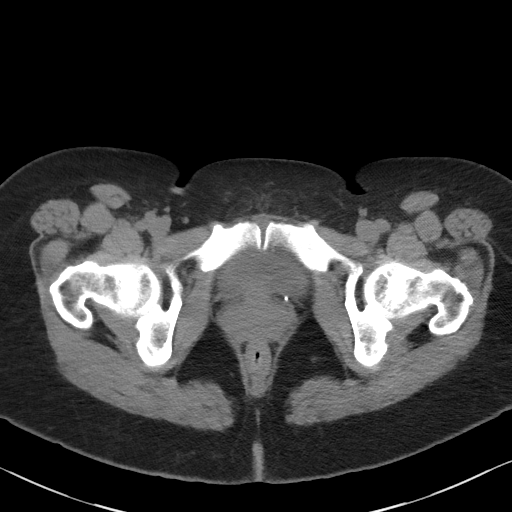
[im 19/92  soft-tissue]
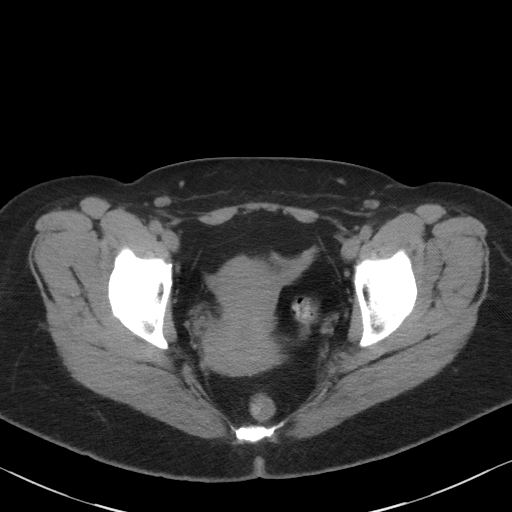
[im 27/92  soft-tissue]
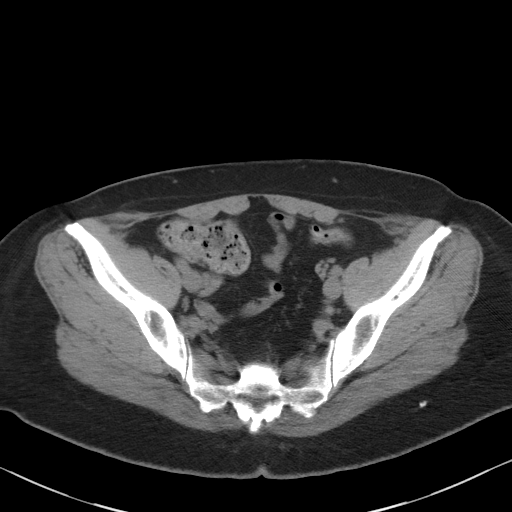
[im 31/92  soft-tissue]
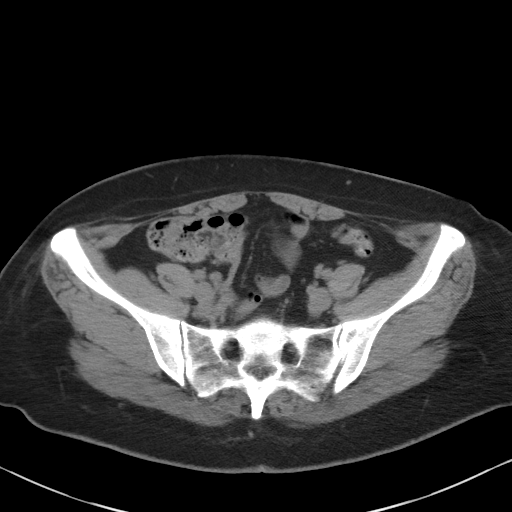
[im 38/92  soft-tissue]
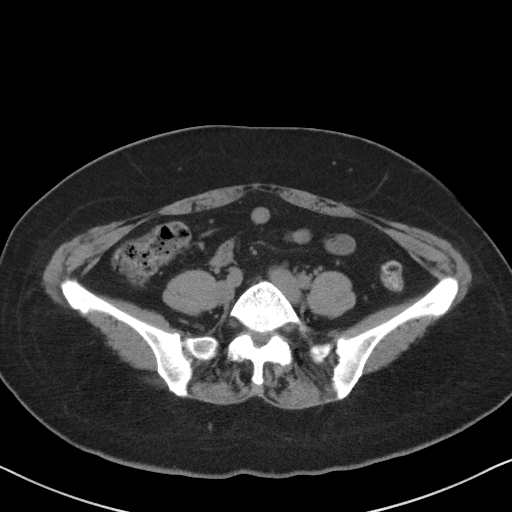
[im 46/92  soft-tissue]
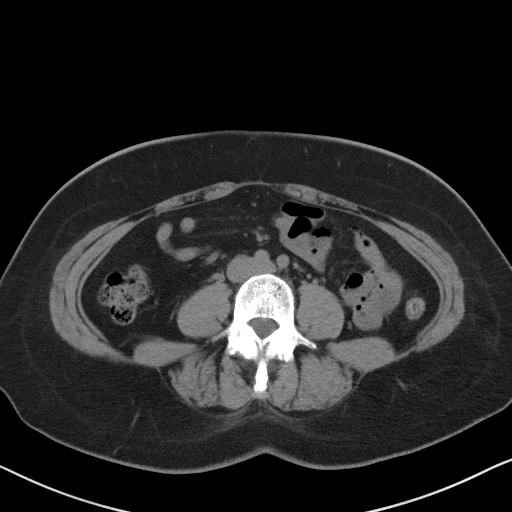
[im 54/92  soft-tissue]
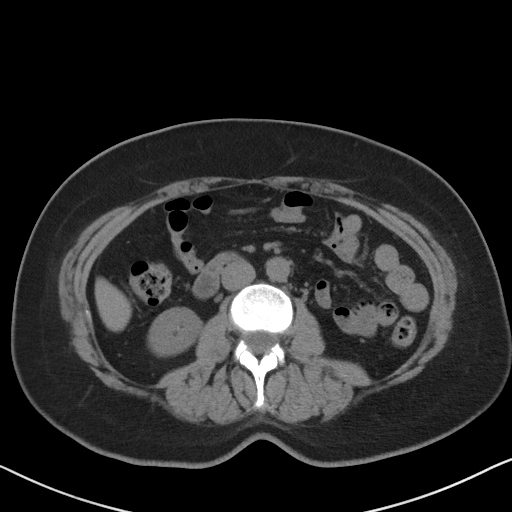
[im 61/92  soft-tissue]
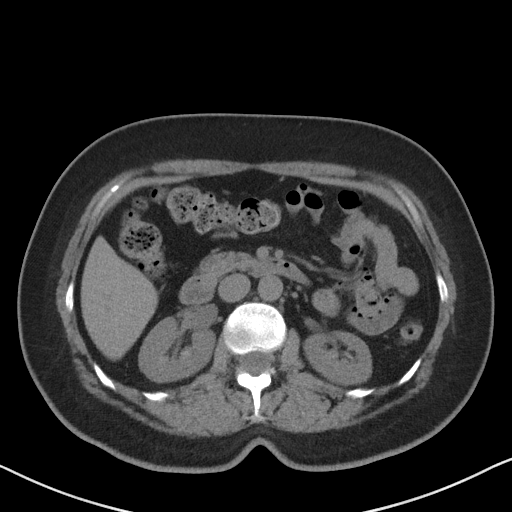
[im 61/92  bone]
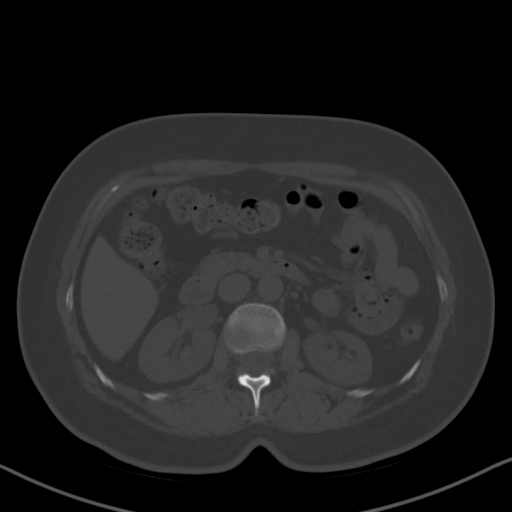
[im 65/92  soft-tissue]
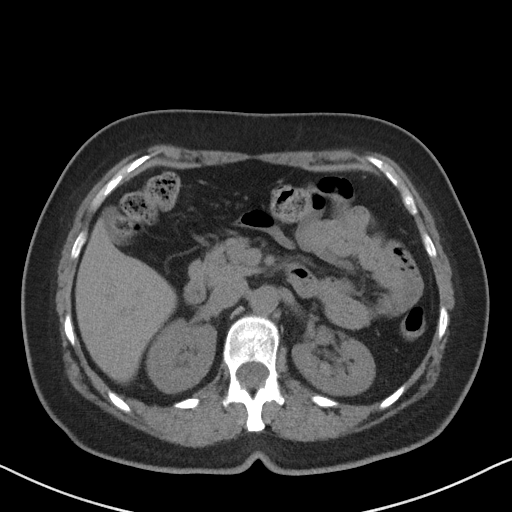
[im 73/92  soft-tissue]
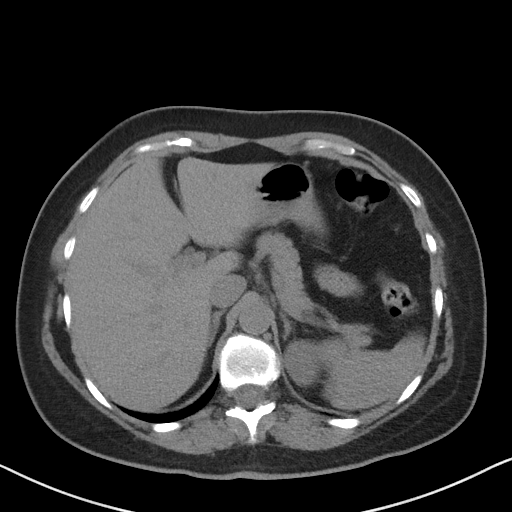
[im 80/92  soft-tissue]
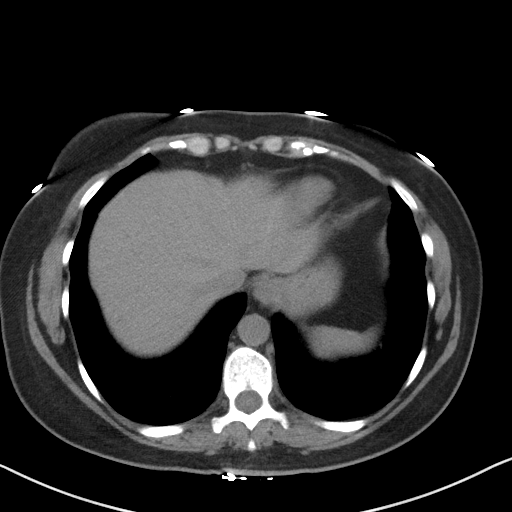
[im 88/92  soft-tissue]
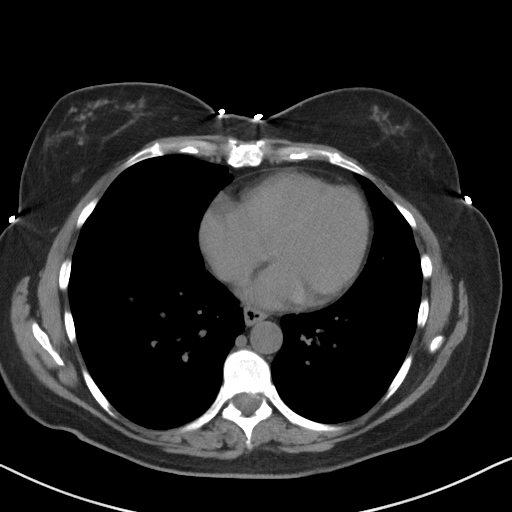

[Series 5: coronal · coronal · 0.74mm/px · 3 of 139 slices shown]
[im 47/139  soft-tissue]
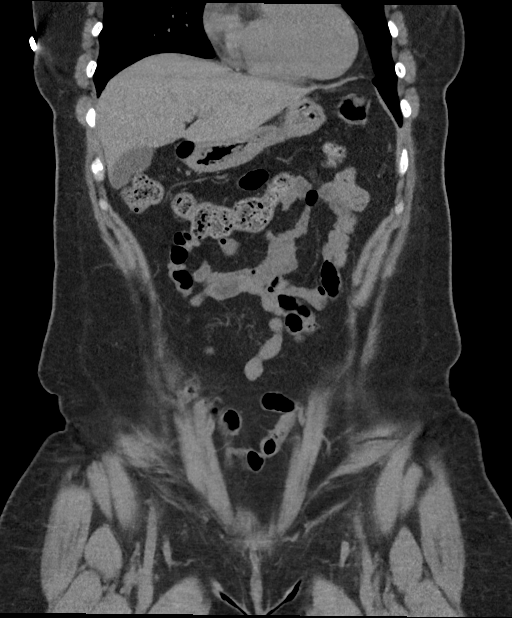
[im 62/139  soft-tissue]
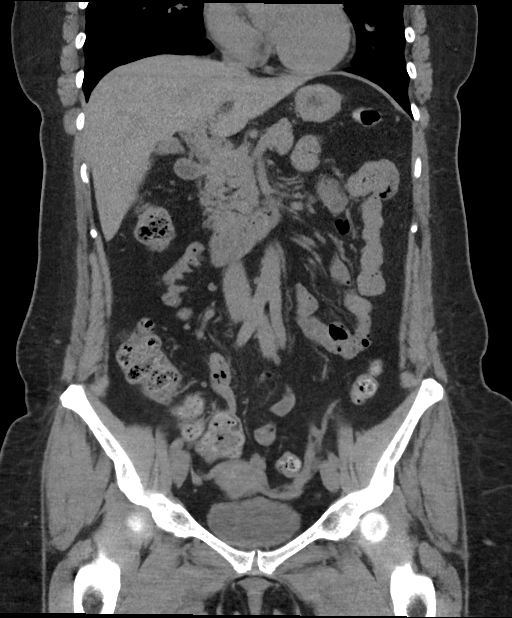
[im 77/139  soft-tissue]
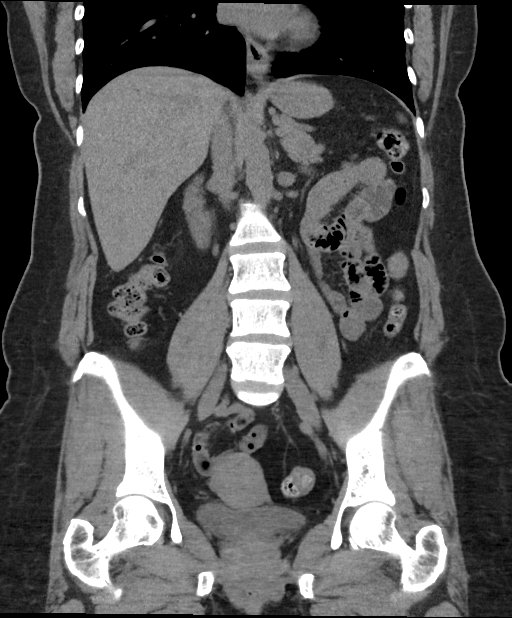

[16 of 46 positions shown; findings below may reference images not displayed]

FINDINGS: Lower chest: Negative.

Hepatobiliary: Liver and gallbladder are unremarkable. No biliary
ductal dilatation.

Pancreas: Negative.

Spleen: Negative.

Adrenals/Urinary Tract: Adrenal glands are unremarkable. Small
stones are seen in the kidneys bilaterally, right greater than left.
Mild right ureteral dilatation secondary to a 6 mm stone in the
distal right ureter. Left ureter is decompressed. Bladder is grossly
unremarkable.

Stomach/Bowel: Stomach, small bowel and colon are unremarkable.
Appendectomy.

Vascular/Lymphatic: Vascular structures are unremarkable. No
pathologically enlarged lymph nodes.

Reproductive: Uterus is visualized. Low density in the region of the
lower uterine segment is unchanged from 07/14/2011. No adnexal mass.

Other: No free fluid.  Mesenteries and peritoneum are unremarkable.

Musculoskeletal: No worrisome lytic or sclerotic lesions.
Degenerative changes in the spine.
IMPRESSION: 1. Mild right hydronephrosis secondary to a 6 mm stone in the distal
right ureter.
2. Bilateral renal stones.

## 2019-02-19 ENCOUNTER — Ambulatory Visit: Admission: EM | Admit: 2019-02-19 | Discharge: 2019-02-19 | Discharge status: Absent without leave | Payer: Self-pay
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
## 2020-04-09 ENCOUNTER — Ambulatory Visit: Admit: 2020-04-09 | Payer: Self-pay

## 2020-04-09 ENCOUNTER — Ambulatory Visit
Admission: EM | Admit: 2020-04-09 | Discharge: 2020-04-09 | Disposition: A | Discharge status: Home / Self Care | Payer: Self-pay | Attending: Family Medicine | Admitting: Family Medicine

## 2020-04-09 ENCOUNTER — Other Ambulatory Visit: Payer: Self-pay

## 2020-04-09 DIAGNOSIS — R300 Dysuria: Secondary | ICD-10-CM | POA: Insufficient documentation

## 2020-04-09 DIAGNOSIS — R109 Unspecified abdominal pain: Secondary | ICD-10-CM | POA: Insufficient documentation

## 2020-04-09 DIAGNOSIS — F411 Generalized anxiety disorder: Secondary | ICD-10-CM | POA: Insufficient documentation

## 2020-04-09 DIAGNOSIS — R319 Hematuria, unspecified: Secondary | ICD-10-CM | POA: Insufficient documentation

## 2020-04-09 LAB — URINALYSIS, COMPLETE (UACMP) WITH MICROSCOPIC
Bilirubin Urine: NEGATIVE
Glucose, UA: NEGATIVE mg/dL
Ketones, ur: 15 mg/dL — AB
Leukocytes,Ua: NEGATIVE
Nitrite: NEGATIVE
Protein, ur: NEGATIVE mg/dL
Specific Gravity, Urine: 1.02 (ref 1.005–1.030)
pH: 5.5 (ref 5.0–8.0)

## 2020-04-09 LAB — BASIC METABOLIC PANEL
Anion gap: 11 (ref 5–15)
BUN: 24 mg/dL — ABNORMAL HIGH (ref 6–20)
CO2: 24 mmol/L (ref 22–32)
Calcium: 9.2 mg/dL (ref 8.9–10.3)
Chloride: 98 mmol/L (ref 98–111)
Creatinine, Ser: 1.06 mg/dL — ABNORMAL HIGH (ref 0.44–1.00)
GFR calc Af Amer: >60 mL/min (ref >60)
GFR calc non Af Amer: 57 mL/min — ABNORMAL LOW (ref >60)
Glucose, Bld: 94 mg/dL (ref 70–99)
Potassium: 3.6 mmol/L (ref 3.5–5.1)
Sodium: 133 mmol/L — ABNORMAL LOW (ref 135–145)

## 2020-04-09 LAB — CBC WITH DIFFERENTIAL/PLATELET
Abs Immature Granulocytes: 0.03 10*3/uL (ref 0.00–0.07)
Basophils Absolute: 0 10*3/uL (ref 0.0–0.1)
Basophils Relative: 0 %
Eosinophils Absolute: 0.2 10*3/uL (ref 0.0–0.5)
Eosinophils Relative: 3 %
HCT: 40 % (ref 36.0–46.0)
Hemoglobin: 13.7 g/dL (ref 12.0–15.0)
Immature Granulocytes: 0 %
Lymphocytes Relative: 33 %
Lymphs Abs: 2.4 10*3/uL (ref 0.7–4.0)
MCH: 30.8 pg (ref 26.0–34.0)
MCHC: 34.3 g/dL (ref 30.0–36.0)
MCV: 89.9 fL (ref 80.0–100.0)
Monocytes Absolute: 0.6 10*3/uL (ref 0.1–1.0)
Monocytes Relative: 8 %
Neutro Abs: 4.2 10*3/uL (ref 1.7–7.7)
Neutrophils Relative %: 56 %
Platelets: 287 10*3/uL (ref 150–400)
RBC: 4.45 MIL/uL (ref 3.87–5.11)
RDW: 13.4 % (ref 11.5–15.5)
WBC: 7.5 10*3/uL (ref 4.0–10.5)
nRBC: 0 % (ref 0.0–0.2)

## 2020-04-09 MED ORDER — ONDANSETRON 4 MG PO TBDP: 4.0000 mg | ORAL_TABLET | Freq: Three times a day (TID) | ORAL | 0 refills | Status: AC | PRN

## 2020-04-09 MED ORDER — TAMSULOSIN HCL 0.4 MG PO CAPS: 0.4000 mg | ORAL_CAPSULE | Freq: Every day | ORAL | 0 refills | Status: AC

## 2020-04-09 MED ORDER — CIPROFLOXACIN HCL 500 MG PO TABS
500.0000 mg | ORAL_TABLET | Freq: Two times a day (BID) | ORAL | 0 refills | Status: DC
Start: 2020-04-09 — End: 2024-04-05

## 2020-04-09 MED ORDER — OXYCODONE-ACETAMINOPHEN 5-325 MG PO TABS: 1.0000 | ORAL_TABLET | Freq: Four times a day (QID) | ORAL | 0 refills | Status: DC | PRN

## 2020-04-09 NOTE — Discharge Instructions (Addendum)
Take medication as prescribed. Rest. Drink plenty of fluids. Use urine strainer.   Follow-up with urology as discussed.  Please establish primary care for regular follow-up.  See above to contact [facility].  Proceed erectly to the emergency room for any increased pain, vomiting, fevers, or worsening concerns.

## 2020-04-09 NOTE — ED Triage Notes (Signed)
Patient has right sided lower abdominal pain and hematuria x Friday. States that the pain has worsened over last few hours. Reports that she has been fatigued and foggy over the last month. States that she took about 3 doses of left over Cipro.

## 2020-04-09 NOTE — ED Provider Notes (Signed)
[facility] ____________________________________________  Time seen: Approximately 7:49 PM  I have reviewed the triage vital signs and the nursing notes.   HISTORY  Chief Complaint Abdominal Pain   HPI Andrea Campbell is a 59 y.o. female presenting for evaluation of right flank and right abdominal pain present for the last 4 days.  States pain is coming and going.  Describes pain as somewhat sharp and aching.  States current pain is mild and aching like a menstrual cramp.  Also reports some urinary discomfort and noticed blood in her urine this past weekend.  Denies nausea, vomiting, diarrhea or constipation.  Denies fevers.  Continues to eat and drink well.  Reports this is consistent with her previous kidney stones.  Last had CT approximately 2 years ago with bilateral nephrolithiasis.  Previously followed with [facility] urological.  No over-the-counter medication taken prior today prior to arrival.  States that she did take 3 leftover Cipro pills.  States has continued to eat and drink well.  Patient also reports she has been under a lot of stress dealing with cleaning her father's home and preparing it for sale.  States this has caused her to have more anxiety.  Further states that she is not here for anxiety management tonight and wants to focus on the concern of urine and abdominal.  Patient states that she will establish primary or follow-up with [facility] which was discussed.  Patient denies suicidal or homicidal ideation.  Denies drug or alcohol use.   History reviewed. No pertinent past medical history.  There are no problems to display for this patient.   Past Surgical History:  Procedure Laterality Date  . APPENDECTOMY    . CERVICAL ABLATION       No current facility-administered medications for this encounter.  Current Outpatient Medications:  .  progesterone (PROMETRIUM) 200 MG capsule, Take 200 mg by mouth daily., Disp: , Rfl:  .  thyroid (ARMOUR) 65 MG  tablet, Take 65 mg by mouth daily., Disp: , Rfl:  .  ciprofloxacin (CIPRO) 500 MG tablet, Take 1 tablet (500 mg total) by mouth every 12 (twelve) hours., Disp: 10 tablet, Rfl: 0 .  ondansetron (ZOFRAN ODT) 4 MG disintegrating tablet, Take 1 tablet (4 mg total) by mouth every 8 (eight) hours as needed for nausea or vomiting., Disp: 15 tablet, Rfl: 0 .  oxyCODONE-acetaminophen (PERCOCET/ROXICET) 5-325 MG tablet, Take 1 tablet by mouth every 6 (six) hours as needed for severe pain. Do not drive while taking as can cause drowsiness., Disp: 6 tablet, Rfl: 0 .  tamsulosin (FLOMAX) 0.4 MG CAPS capsule, Take 1 capsule (0.4 mg total) by mouth daily for 7 days., Disp: 7 capsule, Rfl: 0  Allergies Flu virus vaccine and Haemophilus influenzae  Family History  Problem Relation Age of Onset  . Heart failure Mother   . Dementia Father     Social History Social History   Tobacco Use  . Smoking status: Never Smoker  . Smokeless tobacco: Never Used  Substance Use Topics  . Alcohol use: Yes    Comment: occasionally  . Drug use: No    Review of Systems Constitutional: No fever ENT: No sore throat. Cardiovascular: Denies chest pain. Respiratory: Denies shortness of breath. Gastrointestinal: As above.  Genitourinary: Negative for dysuria. Musculoskeletal: Positive for back pain. Skin: Negative for rash.   ____________________________________________   PHYSICAL EXAM:  VITAL SIGNS: ED Triage Vitals  Enc Vitals Group     BP 04/09/20 1922 (!) 145/86  Pulse Rate 04/09/20 1922 (!) 126 Recheck 102     Resp 04/09/20 1922 18     Temp 04/09/20 1922 98.1 F (36.7 C)     Temp Source 04/09/20 1922 Oral     SpO2 04/09/20 1922 98 %     Weight 04/09/20 1919 180 lb (81.6 kg)     Height 04/09/20 1919 5\' 2"  (1.575 m)     Head Circumference --      Peak Flow --      Pain Score 04/09/20 1918 4     Pain Loc --      Pain Edu? --      Excl. in [place]? --     Constitutional: Alert and oriented.   Appears anxious. Eyes: Conjunctivae are normal.  ENT      Head: Normocephalic and atraumatic. Cardiovascular: Normal rate, regular rhythm. Grossly normal heart sounds.  Good peripheral circulation. Respiratory: Normal respiratory effort without tachypnea nor retractions. Breath sounds are clear and equal bilaterally. No wheezes, rales, rhonchi. Gastrointestinal: Normal bowel sounds.  Minimal suprapubic right suprapubic tenderness palpation, mild right CVA tenderness.  No left CVA tenderness. Musculoskeletal: No midline cervical, thoracic or lumbar tenderness to palpation.  Neurologic:  Normal speech and language. Speech is normal. No gait instability.  Skin:  Skin is warm, dry and intact. No rash noted. Psychiatric: Mood and affect are normal. Speech and behavior are normal. Patient exhibits appropriate insight and judgment   ___________________________________________   LABS (all labs ordered are listed, but only abnormal results are displayed)  Labs Reviewed  URINALYSIS, COMPLETE (UACMP) WITH MICROSCOPIC - Abnormal; Notable for the following components:      Result Value   Hgb urine dipstick TRACE (*)    Ketones, ur 15 (*)    Bacteria, UA FEW (*)    All other components within normal limits  BASIC METABOLIC PANEL - Abnormal; Notable for the following components:   Sodium 133 (*)    BUN 24 (*)    Creatinine, Ser 1.06 (*)    GFR calc non Af Amer 57 (*)    All other components within normal limits  URINE CULTURE  CBC WITH DIFFERENTIAL/PLATELET    RADIOLOGY  Patient declined. PROCEDURES Procedures    INITIAL IMPRESSION / ASSESSMENT AND PLAN / ED COURSE  Pertinent labs & imaging results that were available during my care of the patient were reviewed by me and considered in my medical decision making (see chart for details).   Overall well-appearing patient, though appears anxious.  Suspect stone versus urinary infection.  Patient does also report having recent anxiety  depression but this is also chronic for her per patient, declines request of evaluation of this at this time, encourage patient to establish primary care as well as a follow-up with [facility].  Recommend evaluation for concern of nephrolithiasis by imaging at this time discussed options for both x-ray and CT, patient declined imaging.  Urinalysis reviewed, concern for infection etiology.  Labs reviewed and counseled.  Strongly encourage patient to increase water intake, monitor closely.  We will culture urine.  Will treat patient with Flomax, Zofran as needed, Percocet as needed and Cipro while awaiting urine culture.  Discussed with patient for any increasing pain, inability to tolerate foods and fluids proceed directly to the emergency room.  Follow-up with urology.Discussed indication, risks and benefits of medications with patient.    Discussed follow up and return parameters including no resolution or any worsening concerns. Patient verbalized understanding and agreed to plan.  [place] controlled substance database reviewed, no recent controlled substances document last 2 months. ____________________________________________   FINAL CLINICAL IMPRESSION(S) / ED DIAGNOSES  Final diagnoses:  Dysuria  Hematuria, unspecified type  Right flank pain  Generalized anxiety disorder     ED Discharge Orders         Ordered    oxyCODONE-acetaminophen (PERCOCET/ROXICET) 5-325 MG tablet  Every 6 hours PRN     04/09/20 2000    ondansetron (ZOFRAN ODT) 4 MG disintegrating tablet  Every 8 hours PRN     04/09/20 2000    tamsulosin (FLOMAX) 0.4 MG CAPS capsule  Daily     04/09/20 2000    ciprofloxacin (CIPRO) 500 MG tablet  Every 12 hours     04/09/20 2000           Note: This dictation was prepared with Dragon dictation along with smaller phrase technology. Any transcriptional errors that result from this process are unintentional.         Renford Dills, NP 04/09/20 2020

## 2020-04-10 LAB — URINE CULTURE: Culture: NO GROWTH

## 2020-08-20 ENCOUNTER — Telehealth: Payer: Self-pay

## 2020-08-20 NOTE — Telephone Encounter (Signed)
Copied from CRM [id number]. Topic: Appointment Scheduling - New Patient >> Aug 20, 2020 10:45 AM Andrea Campbell E wrote: New patient would like approval to be scheduled for your office. Provider: Dr. Sherrie Mustache  / Pts Son [name]) id Dr. Theodis Aguas pt and he advised hi Mom to contact office to establish care/ Pt would like to establish care now that she has moved back to the area. but also is having an issue with a kidney stones and needing flomax / please advise if approved to establish asap   Route to department's PEC pool.

## 2020-08-21 NOTE — Telephone Encounter (Signed)
Recommend she see Dr. Leonard Schwartz, i'm not accepting new patients.

## 2020-09-26 ENCOUNTER — Ambulatory Visit: Payer: Self-pay | Admitting: Adult Health

## 2024-01-13 DIAGNOSIS — R39198 Other difficulties with micturition: Secondary | ICD-10-CM | POA: Diagnosis not present

## 2024-01-13 DIAGNOSIS — N3090 Cystitis, unspecified without hematuria: Secondary | ICD-10-CM | POA: Diagnosis not present

## 2024-01-13 DIAGNOSIS — R509 Fever, unspecified: Secondary | ICD-10-CM | POA: Diagnosis not present

## 2024-01-13 DIAGNOSIS — R10817 Generalized abdominal tenderness: Secondary | ICD-10-CM | POA: Diagnosis not present

## 2024-01-13 DIAGNOSIS — N200 Calculus of kidney: Secondary | ICD-10-CM | POA: Diagnosis not present

## 2024-03-22 DIAGNOSIS — N200 Calculus of kidney: Secondary | ICD-10-CM | POA: Diagnosis not present

## 2024-03-22 DIAGNOSIS — N201 Calculus of ureter: Secondary | ICD-10-CM | POA: Diagnosis not present

## 2024-04-02 DIAGNOSIS — N200 Calculus of kidney: Secondary | ICD-10-CM | POA: Diagnosis not present

## 2024-04-05 ENCOUNTER — Ambulatory Visit (INDEPENDENT_AMBULATORY_CARE_PROVIDER_SITE_OTHER)

## 2024-04-05 ENCOUNTER — Ambulatory Visit: Admission: EM | Admit: 2024-04-05 | Discharge: 2024-04-05 | Disposition: A | Discharge status: Home / Self Care

## 2024-04-05 ENCOUNTER — Encounter: Payer: Self-pay | Admitting: Emergency Medicine

## 2024-04-05 DIAGNOSIS — Z87442 Personal history of urinary calculi: Secondary | ICD-10-CM | POA: Insufficient documentation

## 2024-04-05 DIAGNOSIS — R310 Gross hematuria: Secondary | ICD-10-CM | POA: Insufficient documentation

## 2024-04-05 DIAGNOSIS — M549 Dorsalgia, unspecified: Secondary | ICD-10-CM | POA: Insufficient documentation

## 2024-04-05 DIAGNOSIS — N200 Calculus of kidney: Secondary | ICD-10-CM | POA: Insufficient documentation

## 2024-04-05 DIAGNOSIS — R140 Abdominal distension (gaseous): Secondary | ICD-10-CM

## 2024-04-05 DIAGNOSIS — K589 Irritable bowel syndrome without diarrhea: Secondary | ICD-10-CM | POA: Diagnosis not present

## 2024-04-05 DIAGNOSIS — R109 Unspecified abdominal pain: Secondary | ICD-10-CM | POA: Insufficient documentation

## 2024-04-05 LAB — CBC WITH DIFFERENTIAL/PLATELET
Abs Immature Granulocytes: 0.02 10*3/uL (ref 0.00–0.07)
Basophils Absolute: 0 10*3/uL (ref 0.0–0.1)
Basophils Relative: 1 %
Eosinophils Absolute: 0.1 10*3/uL (ref 0.0–0.5)
Eosinophils Relative: 2 %
HCT: 42 % (ref 36.0–46.0)
Hemoglobin: 14.3 g/dL (ref 12.0–15.0)
Immature Granulocytes: 0 %
Lymphocytes Relative: 36 %
Lymphs Abs: 2.3 10*3/uL (ref 0.7–4.0)
MCH: 31.2 pg (ref 26.0–34.0)
MCHC: 34 g/dL (ref 30.0–36.0)
MCV: 91.5 fL (ref 80.0–100.0)
Monocytes Absolute: 0.6 10*3/uL (ref 0.1–1.0)
Monocytes Relative: 10 %
Neutro Abs: 3.3 10*3/uL (ref 1.7–7.7)
Neutrophils Relative %: 51 %
Platelets: 230 10*3/uL (ref 150–400)
RBC: 4.59 MIL/uL (ref 3.87–5.11)
RDW: 13.6 % (ref 11.5–15.5)
WBC: 6.4 10*3/uL (ref 4.0–10.5)
nRBC: 0 % (ref 0.0–0.2)

## 2024-04-05 LAB — URINALYSIS, W/ REFLEX TO CULTURE (INFECTION SUSPECTED)
Bilirubin Urine: NEGATIVE
Glucose, UA: NEGATIVE mg/dL
Ketones, ur: NEGATIVE mg/dL
Leukocytes,Ua: NEGATIVE
Nitrite: NEGATIVE
Protein, ur: NEGATIVE mg/dL
Specific Gravity, Urine: 1.02 (ref 1.005–1.030)
pH: 6 (ref 5.0–8.0)

## 2024-04-05 LAB — BASIC METABOLIC PANEL WITH GFR
Anion gap: 8 (ref 5–15)
BUN: 24 mg/dL — ABNORMAL HIGH (ref 8–23)
CO2: 26 mmol/L (ref 22–32)
Calcium: 9.4 mg/dL (ref 8.9–10.3)
Chloride: 101 mmol/L (ref 98–111)
Creatinine, Ser: 1.16 mg/dL — ABNORMAL HIGH (ref 0.44–1.00)
GFR, Estimated: 53 mL/min — ABNORMAL LOW (ref >60)
Glucose, Bld: 90 mg/dL (ref 70–99)
Potassium: 3.8 mmol/L (ref 3.5–5.1)
Sodium: 135 mmol/L (ref 135–145)

## 2024-04-05 MED ORDER — HYDROCODONE-ACETAMINOPHEN 5-325 MG PO TABS: 1.0000 | ORAL_TABLET | Freq: Four times a day (QID) | ORAL | 0 refills | Status: AC | PRN

## 2024-04-05 MED ORDER — ONDANSETRON 8 MG PO TBDP: 8.0000 mg | ORAL_TABLET | Freq: Three times a day (TID) | ORAL | 0 refills | Status: AC | PRN

## 2024-04-05 NOTE — Discharge Instructions (Addendum)
 Your blood work was very reassuring as it did not show any traumatic worsening of your renal function and no evidence of systemic infection.  Your urinalysis also did not show any evidence of infection.  Your x-ray did show the 2 kidney stones present in your kidneys that were seen on CT scan in January.  There is a questionable kidney stone that you may have passed that is sitting down near the neck of your bladder.  This may also represent a small calcification in your pelvic floor.  The radiology overread is not available just yet.  I would prescribe you Zofran  that she can take every 8 hours as needed for nausea and vomiting.  It will dissolve on or under your tongue.  Continue taking over-the-counter Tylenol  according to the package instructions as needed for mild to moderate pain.  I have also prescribed you Norco that you can use for more severe pain.  You may take 1 tablet every 6 hours as needed.  Be mindful that this also contains Tylenol  so do not take more than 4000 mg of Tylenol  in total in a 24-hour period.  Follow-up with your urologist as soon as possible.  If you have any sharp increase in pain, nausea and vomiting not taking care of by the Zofran , or start running fevers I recommend that you go to the emergency department for evaluation.

## 2024-04-05 NOTE — ED Provider Notes (Addendum)
 [facility]    CSN: [id number] Arrival date & time: 04/05/24  1736      History   Chief Complaint Chief Complaint  Patient presents with   Nausea   Bloated   Abdominal Pain   Back Pain    HPI Andrea Campbell is a 63 y.o. female.   HPI  63 year old female with past medical history significant for IBS and kidney stones presents for evaluation of GI complaints that have been going on for last 5 days.  She is due to have lithotripsy on 04/18/2024 at [facility]  Over the past weekend she developed pain in her back as well as abdominal pain and bloating.  She reports that her back pain has resolved at this point but she has a lot of pressure in her abdomen along with bloating, nausea, chills, and sweats.  She has not measured a fever.  She has been experiencing hematuria along with urinary urgency and frequency.  She denies any pain with urination.  She denies any constipation or diarrhea.  History reviewed. No pertinent past medical history.  There are no active problems to display for this patient.   Past Surgical History:  Procedure Laterality Date   APPENDECTOMY     CERVICAL ABLATION      OB History   No obstetric history on file.      Home Medications    Prior to Admission medications   Medication Sig Start Date End Date Taking? Authorizing Provider  HYDROcodone -acetaminophen  (NORCO/VICODIN) 5-325 MG tablet Take 1 tablet by mouth every 6 (six) hours as needed. 04/05/24  Yes Kent Pear, NP  ondansetron  (ZOFRAN -ODT) 8 MG disintegrating tablet Take 1 tablet (8 mg total) by mouth every 8 (eight) hours as needed for nausea or vomiting. 04/05/24  Yes Kent Pear, NP  progesterone (PROMETRIUM) 200 MG capsule Take 200 mg by mouth daily.   Yes [provider]  tamsulosin  (FLOMAX ) 0.4 MG CAPS capsule Take 1 tablet by mouth daily. 03/22/24  Yes [provider]  ondansetron  (ZOFRAN  ODT) 4 MG disintegrating tablet Take 1 tablet (4 mg total) by mouth every 8  (eight) hours as needed for nausea or vomiting. 04/09/20   Janett Medin, NP  thyroid (ARMOUR) 65 MG tablet Take 65 mg by mouth daily.    [provider]    Family History Family History  Problem Relation Age of Onset   Heart failure Mother    Dementia Father     Social History Social History   Tobacco Use   Smoking status: Never   Smokeless tobacco: Never  Vaping Use   Vaping status: Never Used  Substance Use Topics   Alcohol use: Yes    Comment: occasionally   Drug use: No     Allergies   Haemophilus influenzae, Influenza virus vaccine, Cephalexin, and Lidocaine   Review of Systems Review of Systems  Constitutional:  Positive for chills. Negative for fever.  Gastrointestinal:  Positive for abdominal distention, abdominal pain and nausea. Negative for blood in stool, constipation, diarrhea and vomiting.  Genitourinary:  Positive for frequency, hematuria and urgency. Negative for dysuria.  Musculoskeletal:  Positive for back pain.     Physical Exam Triage Vital Signs ED Triage Vitals  Encounter Vitals Group     BP      Systolic BP Percentile      Diastolic BP Percentile      Pulse      Resp      Temp  Temp src      SpO2      Weight      Height      Head Circumference      Peak Flow      Pain Score      Pain Loc      Pain Education      Exclude from Growth Chart    No data found.  Updated Vital Signs BP (!) 161/90 (BP Location: Left Arm)   Pulse (!) 109   Temp 98.1 F (36.7 C) (Oral)   Resp 18   SpO2 98%   Visual Acuity Right Eye Distance:   Left Eye Distance:   Bilateral Distance:    Right Eye Near:   Left Eye Near:    Bilateral Near:     Physical Exam Vitals and nursing note reviewed.  Constitutional:      Appearance: Normal appearance. She is not ill-appearing.  HENT:     Head: Normocephalic and atraumatic.  Cardiovascular:     Rate and Rhythm: Normal rate and regular rhythm.     Pulses: Normal pulses.     Heart  sounds: Normal heart sounds. No murmur heard.    No friction rub. No gallop.  Pulmonary:     Effort: Pulmonary effort is normal.     Breath sounds: Normal breath sounds. No wheezing, rhonchi or rales.  Abdominal:     General: Abdomen is flat.     Palpations: Abdomen is soft.     Tenderness: There is abdominal tenderness. There is no right CVA tenderness, left CVA tenderness, guarding or rebound.     Comments: Mild suprapubic tenderness.  No guarding or rebound.  Abdomen is mildly distended but soft.  Skin:    General: Skin is warm and dry.     Capillary Refill: Capillary refill takes less than 2 seconds.  Neurological:     General: No focal deficit present.     Mental Status: She is alert and oriented to person, place, and time.      UC Treatments / Results  Labs (all labs ordered are listed, but only abnormal results are displayed) Labs Reviewed  URINALYSIS, W/ REFLEX TO CULTURE (INFECTION SUSPECTED) - Abnormal; Notable for the following components:      Result Value   Hgb urine dipstick TRACE (*)    Bacteria, UA FEW (*)    All other components within normal limits  BASIC METABOLIC PANEL WITH GFR - Abnormal; Notable for the following components:   BUN 24 (*)    Creatinine, Ser 1.16 (*)    GFR, Estimated 53 (*)    All other components within normal limits  CBC WITH DIFFERENTIAL/PLATELET    EKG   Radiology No results found.  Procedures Procedures (including critical care time)  Medications Ordered in UC Medications - No data to display  Initial Impression / Assessment and Plan / UC Course  I have reviewed the triage vital signs and the nursing notes.  Pertinent labs & imaging results that were available during my care of the patient were reviewed by me and considered in my medical decision making (see chart for details).   Patient is a pleasant, nontoxic-appearing 63 old female presenting for evaluation GI complaints as outlined HPI above.  She does have a history  of IBS and she is currently scheduled to have lithotripsy to remove kidney stones present in both kidneys, 7 mm on the right, and 4 mm on the left that were found on CT  scan dated 01/13/2024.  The patient thinks that her bloating may be due to an IBS flare as a result of the inflammatory process that she is experiencing from her kidney stones.  She does have a photo on her phone of the urine that she was passing over the weekend which is gross hematuria.  She has a sample at the bedside in the exam room which is yellow in color.  She has no CVA tenderness in the exam room but she is tender suprapubically.  Given the degree of hematuria that she is experiencing my concern is that she is trying to pass a kidney stone.  I will order a urinalysis to evaluate for the presence of microscopic blood or possible infection, CBC and CMP to evaluate for systemic infection or for any impact to her renal function, and KUB to evaluate for any ureteral stone present.  Patient has no open scripts in PDMP.  Plan to discharge home with analgesia and antiemetics to bridge her until she can see urology.  Urinalysis shows trace hemoglobin but negative for leukocyte esterase, nitrates, protein, ketones, or glucose.  Reflex microscopy shows 6-10 RBCs with few bacteria.  KUB independently reviewed and evaluated by me.  Impression: There are 2 clearly defined radiolucencies within each kidney.  The right kidney the lucency appears to be in the renal pelvis and the left kidney and appears to be in the upper pole.  There is a questionable kidney stone versus phlebolith at the right UVJ.  There are multiple phleboliths in the pelvis on the left.  Radiology overread is pending. The allergy impression states bilateral kidney stones and nonobstructive bowel gas pattern.  CBC shows normal white count of 6.4.  BMP shows elevated BUN at 24 and a creatinine of 1.16.  This is only an interval change from 03/22/2024 when it was is 26 and  0.92.  I will discharge patient home with a diagnosis of renal stones and prescribe her Zofran  that she can take every 8 hours as needed for nausea and vomiting.  I will also prescribe a short prescription of Norco that she can use for more severe pain and take 1 tablet every 6 hours as needed.   Final Clinical Impressions(s) / UC Diagnoses   Final diagnoses:  Abdominal bloating  Kidney stone  Abdominal pain, unspecified abdominal location     Discharge Instructions      Your blood work was very reassuring as it did not show any traumatic worsening of your renal function and no evidence of systemic infection.  Your urinalysis also did not show any evidence of infection.  Your x-ray did show the 2 kidney stones present in your kidneys that were seen on CT scan in January.  There is a questionable kidney stone that you may have passed that is sitting down near the neck of your bladder.  This may also represent a small calcification in your pelvic floor.  The radiology overread is not available just yet.  I would prescribe you Zofran  that she can take every 8 hours as needed for nausea and vomiting.  It will dissolve on or under your tongue.  Continue taking over-the-counter Tylenol  according to the package instructions as needed for mild to moderate pain.  I have also prescribed you Norco that you can use for more severe pain.  You may take 1 tablet every 6 hours as needed.  Be mindful that this also contains Tylenol  so do not take more than 4000 mg of  Tylenol  in total in a 24-hour period.  Follow-up with your urologist as soon as possible.  If you have any sharp increase in pain, nausea and vomiting not taking care of by the Zofran , or start running fevers I recommend that you go to the emergency department for evaluation.     ED Prescriptions     Medication Sig Dispense Auth. Provider   ondansetron  (ZOFRAN -ODT) 8 MG disintegrating tablet Take 1 tablet (8 mg total) by mouth every 8  (eight) hours as needed for nausea or vomiting. 20 tablet Kent Pear, NP   HYDROcodone -acetaminophen  (NORCO/VICODIN) 5-325 MG tablet Take 1 tablet by mouth every 6 (six) hours as needed. 10 tablet Kent Pear, NP      I have reviewed the PDMP during this encounter.   Kent Pear, NP 04/05/24 Trenia Fritter    Kent Pear, NP 04/05/24 1958

## 2024-04-05 NOTE — ED Triage Notes (Signed)
 Pt presents with nausea, bloating, abdominal pain, chills and back pain x 5 days. She has a kidney stone and a lithotripsy scheduled for 04/18/24. She did call her urologist but she was advised to take Tylenol  and it is not helping.

## 2024-04-18 DIAGNOSIS — N200 Calculus of kidney: Secondary | ICD-10-CM | POA: Diagnosis not present

## 2024-05-30 DIAGNOSIS — N200 Calculus of kidney: Secondary | ICD-10-CM | POA: Diagnosis not present

## 2024-05-30 DIAGNOSIS — N3289 Other specified disorders of bladder: Secondary | ICD-10-CM | POA: Diagnosis not present
